# Patient Record
Sex: Female | Born: 1965 | Race: Black or African American | Hispanic: No | Marital: Single | Smoking: Never smoker
Health system: Southern US, Community
[De-identification: ages and names within clinical notes are randomized; demographics above are authoritative.]

## PROBLEM LIST (undated history)

## (undated) DIAGNOSIS — E7800 Pure hypercholesterolemia, unspecified: Secondary | ICD-10-CM

## (undated) DIAGNOSIS — I10 Essential (primary) hypertension: Secondary | ICD-10-CM

## (undated) DIAGNOSIS — R55 Syncope and collapse: Secondary | ICD-10-CM

---

## 1998-01-25 HISTORY — PX: NM GATED MYOCARDIAL STUDY (ARMX HX): HXRAD1849

## 2003-02-26 ENCOUNTER — Other Ambulatory Visit: Payer: Self-pay

## 2004-10-15 ENCOUNTER — Emergency Department: Payer: Self-pay | Admitting: Emergency Medicine

## 2006-02-19 ENCOUNTER — Emergency Department: Payer: Self-pay | Admitting: Emergency Medicine

## 2006-05-13 ENCOUNTER — Emergency Department: Payer: Self-pay | Admitting: Internal Medicine

## 2006-07-04 ENCOUNTER — Emergency Department: Payer: Self-pay | Admitting: Emergency Medicine

## 2006-12-11 ENCOUNTER — Emergency Department: Payer: Self-pay | Admitting: Emergency Medicine

## 2006-12-11 ENCOUNTER — Other Ambulatory Visit: Payer: Self-pay

## 2006-12-17 ENCOUNTER — Ambulatory Visit: Payer: Self-pay | Admitting: Emergency Medicine

## 2007-03-03 ENCOUNTER — Observation Stay: Payer: Self-pay | Admitting: Internal Medicine

## 2007-03-03 ENCOUNTER — Other Ambulatory Visit: Payer: Self-pay

## 2007-11-16 ENCOUNTER — Emergency Department: Payer: Self-pay | Admitting: Emergency Medicine

## 2008-04-04 ENCOUNTER — Emergency Department: Payer: Self-pay | Admitting: Emergency Medicine

## 2008-04-06 ENCOUNTER — Inpatient Hospital Stay: Payer: Self-pay | Admitting: Internal Medicine

## 2008-06-09 ENCOUNTER — Emergency Department: Payer: Self-pay | Admitting: Emergency Medicine

## 2008-07-31 ENCOUNTER — Emergency Department: Payer: Self-pay | Admitting: Emergency Medicine

## 2008-09-28 ENCOUNTER — Ambulatory Visit: Payer: Self-pay | Admitting: Family Medicine

## 2008-11-06 ENCOUNTER — Emergency Department: Payer: Self-pay | Admitting: Internal Medicine

## 2011-02-13 ENCOUNTER — Emergency Department: Payer: Self-pay | Admitting: Emergency Medicine

## 2011-08-05 ENCOUNTER — Emergency Department: Payer: Self-pay | Admitting: Emergency Medicine

## 2011-08-05 LAB — URINALYSIS, COMPLETE
Glucose,UR: NEGATIVE mg/dL (ref 0–75)
Hyaline Cast: 4
Leukocyte Esterase: NEGATIVE
Nitrite: NEGATIVE
Ph: 5 (ref 4.5–8.0)
Protein: 100
Specific Gravity: 1.024 (ref 1.003–1.030)

## 2011-08-05 LAB — COMPREHENSIVE METABOLIC PANEL
Albumin: 3.7 g/dL (ref 3.4–5.0)
Alkaline Phosphatase: 140 U/L — ABNORMAL HIGH (ref 50–136)
Anion Gap: 13 (ref 7–16)
BUN: 9 mg/dL (ref 7–18)
Chloride: 101 mmol/L (ref 98–107)
Co2: 24 mmol/L (ref 21–32)
Creatinine: 0.68 mg/dL (ref 0.60–1.30)
EGFR (African American): >60
EGFR (Non-African Amer.): >60
Glucose: 92 mg/dL (ref 65–99)
Potassium: 4 mmol/L (ref 3.5–5.1)
SGOT(AST): 57 U/L — ABNORMAL HIGH (ref 15–37)
SGPT (ALT): 57 U/L
Sodium: 138 mmol/L (ref 136–145)
Total Protein: 8.4 g/dL — ABNORMAL HIGH (ref 6.4–8.2)

## 2011-08-05 LAB — CBC
HGB: 12.8 g/dL (ref 12.0–16.0)
MCHC: 33.1 g/dL (ref 32.0–36.0)
MCV: 99 fL (ref 80–100)
Platelet: 289 10*3/uL (ref 150–440)
RDW: 12.5 % (ref 11.5–14.5)

## 2011-08-05 LAB — PREGNANCY, URINE: Pregnancy Test, Urine: NEGATIVE m[IU]/mL

## 2012-05-25 ENCOUNTER — Emergency Department: Payer: Self-pay | Admitting: Emergency Medicine

## 2012-05-25 LAB — DRUG SCREEN, URINE
Barbiturates, Ur Screen: NEGATIVE (ref ?–200)
Benzodiazepine, Ur Scrn: NEGATIVE (ref ?–200)
Cannabinoid 50 Ng, Ur ~~LOC~~: NEGATIVE (ref ?–50)
MDMA (Ecstasy)Ur Screen: NEGATIVE (ref ?–500)
Methadone, Ur Screen: NEGATIVE (ref ?–300)
Phencyclidine (PCP) Ur S: NEGATIVE (ref ?–25)
Tricyclic, Ur Screen: NEGATIVE (ref ?–1000)

## 2012-05-25 LAB — URINALYSIS, COMPLETE
Bacteria: NONE SEEN
Bilirubin,UR: NEGATIVE
Glucose,UR: NEGATIVE mg/dL (ref 0–75)
Leukocyte Esterase: NEGATIVE
Nitrite: NEGATIVE
Protein: 30
Squamous Epithelial: 1
WBC UR: 2 /HPF (ref 0–5)

## 2012-05-25 LAB — CBC
HCT: 39.3 % (ref 35.0–47.0)
HGB: 13.4 g/dL (ref 12.0–16.0)
MCH: 33.4 pg (ref 26.0–34.0)
MCHC: 34.1 g/dL (ref 32.0–36.0)
MCV: 98 fL (ref 80–100)
Platelet: 216 10*3/uL (ref 150–440)
RBC: 4.01 10*6/uL (ref 3.80–5.20)
RDW: 12.8 % (ref 11.5–14.5)
WBC: 5.8 10*3/uL (ref 3.6–11.0)

## 2012-05-25 LAB — COMPREHENSIVE METABOLIC PANEL
Albumin: 4.2 g/dL (ref 3.4–5.0)
Alkaline Phosphatase: 127 U/L (ref 50–136)
Anion Gap: 11 (ref 7–16)
Bilirubin,Total: 0.5 mg/dL (ref 0.2–1.0)
Calcium, Total: 10.3 mg/dL — ABNORMAL HIGH (ref 8.5–10.1)
Chloride: 99 mmol/L (ref 98–107)
Creatinine: 0.74 mg/dL (ref 0.60–1.30)
Glucose: 103 mg/dL — ABNORMAL HIGH (ref 65–99)
Osmolality: 269 (ref 275–301)
Potassium: 3.5 mmol/L (ref 3.5–5.1)
Sodium: 134 mmol/L — ABNORMAL LOW (ref 136–145)
Total Protein: 9.4 g/dL — ABNORMAL HIGH (ref 6.4–8.2)

## 2012-05-25 LAB — TROPONIN I: Troponin-I: <0.02 ng/mL

## 2012-05-25 LAB — ETHANOL
Ethanol %: <0.003 % (ref 0.000–0.080)
Ethanol: <3 mg/dL

## 2012-05-25 LAB — MAGNESIUM: Magnesium: 1.6 mg/dL — ABNORMAL LOW

## 2012-05-25 LAB — CK TOTAL AND CKMB (NOT AT ARMC)
CK, Total: 135 U/L (ref 21–215)
CK-MB: <0.5 ng/mL — ABNORMAL LOW (ref 0.5–3.6)

## 2012-10-16 ENCOUNTER — Emergency Department: Payer: Self-pay | Admitting: Emergency Medicine

## 2013-04-13 ENCOUNTER — Emergency Department: Payer: Self-pay | Admitting: Emergency Medicine

## 2013-04-13 LAB — BASIC METABOLIC PANEL
Anion Gap: 3 — ABNORMAL LOW (ref 7–16)
BUN: 14 mg/dL (ref 7–18)
CHLORIDE: 104 mmol/L (ref 98–107)
CO2: 27 mmol/L (ref 21–32)
CREATININE: 0.6 mg/dL (ref 0.60–1.30)
Calcium, Total: 9.1 mg/dL (ref 8.5–10.1)
Glucose: 98 mg/dL (ref 65–99)
Osmolality: 269 (ref 275–301)
Potassium: 4.4 mmol/L (ref 3.5–5.1)
Sodium: 134 mmol/L — ABNORMAL LOW (ref 136–145)

## 2013-04-13 LAB — CBC
HCT: 36 % (ref 35.0–47.0)
HGB: 12.1 g/dL (ref 12.0–16.0)
MCH: 33.5 pg (ref 26.0–34.0)
MCHC: 33.6 g/dL (ref 32.0–36.0)
MCV: 100 fL (ref 80–100)
Platelet: 229 10*3/uL (ref 150–440)
RBC: 3.61 10*6/uL — ABNORMAL LOW (ref 3.80–5.20)
RDW: 12.7 % (ref 11.5–14.5)
WBC: 5.5 10*3/uL (ref 3.6–11.0)

## 2013-04-14 ENCOUNTER — Emergency Department: Payer: Self-pay | Admitting: Emergency Medicine

## 2013-10-02 ENCOUNTER — Emergency Department: Payer: Self-pay | Admitting: Emergency Medicine

## 2013-10-02 LAB — COMPREHENSIVE METABOLIC PANEL
ALT: 38 U/L
AST: 44 U/L — AB (ref 15–37)
Albumin: 3.9 g/dL (ref 3.4–5.0)
Alkaline Phosphatase: 132 U/L — ABNORMAL HIGH
Anion Gap: 12 (ref 7–16)
BUN: 12 mg/dL (ref 7–18)
Bilirubin,Total: 0.3 mg/dL (ref 0.2–1.0)
CREATININE: 0.81 mg/dL (ref 0.60–1.30)
Calcium, Total: 9.4 mg/dL (ref 8.5–10.1)
Chloride: 101 mmol/L (ref 98–107)
Co2: 22 mmol/L (ref 21–32)
EGFR (Non-African Amer.): >60
Glucose: 112 mg/dL — ABNORMAL HIGH (ref 65–99)
Osmolality: 271 (ref 275–301)
Potassium: 3.3 mmol/L — ABNORMAL LOW (ref 3.5–5.1)
SODIUM: 135 mmol/L — AB (ref 136–145)
Total Protein: 8.8 g/dL — ABNORMAL HIGH (ref 6.4–8.2)

## 2013-10-02 LAB — CBC
HCT: 41.3 % (ref 35.0–47.0)
HGB: 13.6 g/dL (ref 12.0–16.0)
MCH: 32.9 pg (ref 26.0–34.0)
MCHC: 32.9 g/dL (ref 32.0–36.0)
MCV: 100 fL (ref 80–100)
Platelet: 219 10*3/uL (ref 150–440)
RBC: 4.13 10*6/uL (ref 3.80–5.20)
RDW: 13 % (ref 11.5–14.5)
WBC: 8.1 10*3/uL (ref 3.6–11.0)

## 2013-10-02 LAB — URINALYSIS, COMPLETE
Bacteria: NONE SEEN
Bilirubin,UR: NEGATIVE
Glucose,UR: NEGATIVE mg/dL (ref 0–75)
Ketone: NEGATIVE
LEUKOCYTE ESTERASE: NEGATIVE
NITRITE: NEGATIVE
PH: 5 (ref 4.5–8.0)
Protein: 100
RBC,UR: 2 /HPF (ref 0–5)
SPECIFIC GRAVITY: 1.008 (ref 1.003–1.030)
Squamous Epithelial: 1

## 2013-10-02 LAB — DRUG SCREEN, URINE
AMPHETAMINES, UR SCREEN: NEGATIVE (ref ?–1000)
Barbiturates, Ur Screen: NEGATIVE (ref ?–200)
Benzodiazepine, Ur Scrn: NEGATIVE (ref ?–200)
COCAINE METABOLITE, UR ~~LOC~~: POSITIVE (ref ?–300)
Cannabinoid 50 Ng, Ur ~~LOC~~: NEGATIVE (ref ?–50)
MDMA (Ecstasy)Ur Screen: NEGATIVE (ref ?–500)
METHADONE, UR SCREEN: NEGATIVE (ref ?–300)
Opiate, Ur Screen: NEGATIVE (ref ?–300)
Phencyclidine (PCP) Ur S: NEGATIVE (ref ?–25)
TRICYCLIC, UR SCREEN: NEGATIVE (ref ?–1000)

## 2013-10-02 LAB — ETHANOL
ETHANOL %: 0.298 % — AB (ref 0.000–0.080)
Ethanol: 298 mg/dL

## 2013-10-02 LAB — SALICYLATE LEVEL: Salicylates, Serum: <1.7 mg/dL

## 2013-10-02 LAB — ACETAMINOPHEN LEVEL: Acetaminophen: <2 ug/mL

## 2014-02-13 ENCOUNTER — Emergency Department: Payer: Self-pay | Admitting: Emergency Medicine

## 2014-02-13 LAB — BASIC METABOLIC PANEL
ANION GAP: 10 (ref 7–16)
BUN: 11 mg/dL (ref 7–18)
CO2: 23 mmol/L (ref 21–32)
Calcium, Total: 8.6 mg/dL (ref 8.5–10.1)
Chloride: 106 mmol/L (ref 98–107)
Creatinine: 0.73 mg/dL (ref 0.60–1.30)
EGFR (African American): >60
EGFR (Non-African Amer.): >60
Glucose: 95 mg/dL (ref 65–99)
Osmolality: 277 (ref 275–301)
POTASSIUM: 4 mmol/L (ref 3.5–5.1)
Sodium: 139 mmol/L (ref 136–145)

## 2014-02-13 LAB — CBC
HCT: 38.7 % (ref 35.0–47.0)
HGB: 12.6 g/dL (ref 12.0–16.0)
MCH: 32.5 pg (ref 26.0–34.0)
MCHC: 32.6 g/dL (ref 32.0–36.0)
MCV: 100 fL (ref 80–100)
PLATELETS: 242 10*3/uL (ref 150–440)
RBC: 3.88 10*6/uL (ref 3.80–5.20)
RDW: 12.8 % (ref 11.5–14.5)
WBC: 4.2 10*3/uL (ref 3.6–11.0)

## 2014-02-13 LAB — TROPONIN I: Troponin-I: <0.02 ng/mL

## 2014-05-12 ENCOUNTER — Emergency Department: Payer: Self-pay | Admitting: Emergency Medicine
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
## 2014-11-25 HISTORY — PX: TRANSTHORACIC ECHOCARDIOGRAM: SHX275

## 2014-12-06 ENCOUNTER — Encounter: Payer: Self-pay | Admitting: Radiology

## 2014-12-06 ENCOUNTER — Observation Stay
Admission: EM | Admit: 2014-12-06 | Discharge: 2014-12-07 | Disposition: A | Discharge status: Home / Self Care | Payer: Medicaid Other | Attending: Specialist | Admitting: Specialist

## 2014-12-06 ENCOUNTER — Observation Stay (HOSPITAL_BASED_OUTPATIENT_CLINIC_OR_DEPARTMENT_OTHER)
Admit: 2014-12-06 | Discharge: 2014-12-06 | Disposition: A | Discharge status: Home / Self Care | Payer: Medicaid Other | Attending: Internal Medicine | Admitting: Internal Medicine

## 2014-12-06 ENCOUNTER — Emergency Department: Payer: Medicaid Other

## 2014-12-06 DIAGNOSIS — I071 Rheumatic tricuspid insufficiency: Secondary | ICD-10-CM | POA: Diagnosis not present

## 2014-12-06 DIAGNOSIS — J9811 Atelectasis: Secondary | ICD-10-CM | POA: Insufficient documentation

## 2014-12-06 DIAGNOSIS — I340 Nonrheumatic mitral (valve) insufficiency: Secondary | ICD-10-CM | POA: Insufficient documentation

## 2014-12-06 DIAGNOSIS — F1010 Alcohol abuse, uncomplicated: Secondary | ICD-10-CM | POA: Diagnosis not present

## 2014-12-06 DIAGNOSIS — I10 Essential (primary) hypertension: Secondary | ICD-10-CM | POA: Insufficient documentation

## 2014-12-06 DIAGNOSIS — R0989 Other specified symptoms and signs involving the circulatory and respiratory systems: Secondary | ICD-10-CM

## 2014-12-06 DIAGNOSIS — R0602 Shortness of breath: Secondary | ICD-10-CM | POA: Insufficient documentation

## 2014-12-06 DIAGNOSIS — R079 Chest pain, unspecified: Principal | ICD-10-CM | POA: Insufficient documentation

## 2014-12-06 DIAGNOSIS — R0600 Dyspnea, unspecified: Secondary | ICD-10-CM | POA: Insufficient documentation

## 2014-12-06 DIAGNOSIS — Z8249 Family history of ischemic heart disease and other diseases of the circulatory system: Secondary | ICD-10-CM | POA: Insufficient documentation

## 2014-12-06 DIAGNOSIS — I951 Orthostatic hypotension: Secondary | ICD-10-CM | POA: Diagnosis not present

## 2014-12-06 DIAGNOSIS — R001 Bradycardia, unspecified: Secondary | ICD-10-CM | POA: Diagnosis not present

## 2014-12-06 DIAGNOSIS — M545 Low back pain: Secondary | ICD-10-CM | POA: Diagnosis not present

## 2014-12-06 DIAGNOSIS — K760 Fatty (change of) liver, not elsewhere classified: Secondary | ICD-10-CM | POA: Diagnosis not present

## 2014-12-06 DIAGNOSIS — R0609 Other forms of dyspnea: Secondary | ICD-10-CM

## 2014-12-06 DIAGNOSIS — F1410 Cocaine abuse, uncomplicated: Secondary | ICD-10-CM | POA: Insufficient documentation

## 2014-12-06 HISTORY — DX: Essential (primary) hypertension: I10

## 2014-12-06 LAB — CBC
HEMATOCRIT: 37.7 % (ref 35.0–47.0)
Hemoglobin: 12.5 g/dL (ref 12.0–16.0)
MCH: 32.1 pg (ref 26.0–34.0)
MCHC: 33.1 g/dL (ref 32.0–36.0)
MCV: 96.8 fL (ref 80.0–100.0)
PLATELETS: 232 10*3/uL (ref 150–440)
RBC: 3.89 MIL/uL (ref 3.80–5.20)
RDW: 13.1 % (ref 11.5–14.5)
WBC: 4.7 10*3/uL (ref 3.6–11.0)

## 2014-12-06 LAB — URINE DRUG SCREEN, QUALITATIVE (ARMC ONLY)
AMPHETAMINES, UR SCREEN: NOT DETECTED
BARBITURATES, UR SCREEN: NOT DETECTED
BENZODIAZEPINE, UR SCRN: NOT DETECTED
CANNABINOID 50 NG, UR ~~LOC~~: NOT DETECTED
Cocaine Metabolite,Ur ~~LOC~~: POSITIVE — AB
MDMA (Ecstasy)Ur Screen: NOT DETECTED
Methadone Scn, Ur: NOT DETECTED
Opiate, Ur Screen: NOT DETECTED
PHENCYCLIDINE (PCP) UR S: NOT DETECTED
Tricyclic, Ur Screen: NOT DETECTED

## 2014-12-06 LAB — BASIC METABOLIC PANEL
ANION GAP: 9 (ref 5–15)
BUN: 13 mg/dL (ref 6–20)
CALCIUM: 9.8 mg/dL (ref 8.9–10.3)
CO2: 28 mmol/L (ref 22–32)
CREATININE: 0.63 mg/dL (ref 0.44–1.00)
Chloride: 102 mmol/L (ref 101–111)
GLUCOSE: 140 mg/dL — AB (ref 65–99)
POTASSIUM: 4.2 mmol/L (ref 3.5–5.1)
SODIUM: 139 mmol/L (ref 135–145)

## 2014-12-06 LAB — APTT: APTT: 27 s (ref 24–36)

## 2014-12-06 LAB — PROTIME-INR
INR: 0.98
Prothrombin Time: 13.2 seconds (ref 11.4–15.0)

## 2014-12-06 LAB — TROPONIN I
Troponin I: <0.03 ng/mL (ref <0.031)
Troponin I: <0.03 ng/mL (ref <0.031)
Troponin I: <0.03 ng/mL (ref <0.031)

## 2014-12-06 MED ORDER — ONDANSETRON HCL 4 MG/2ML IJ SOLN
4.0000 mg | Freq: Four times a day (QID) | INTRAMUSCULAR | Status: DC | PRN
  Administered 2014-12-06: 4 mg via INTRAVENOUS
  Filled 2014-12-06: qty 2

## 2014-12-06 MED ORDER — ENOXAPARIN SODIUM 40 MG/0.4ML ~~LOC~~ SOLN
40.0000 mg | SUBCUTANEOUS | Status: DC
  Administered 2014-12-06: 40 mg via SUBCUTANEOUS
  Filled 2014-12-06: qty 0.4

## 2014-12-06 MED ORDER — METOPROLOL TARTRATE 25 MG PO TABS: 25.0000 mg | ORAL_TABLET | Freq: Two times a day (BID) | ORAL | Status: DC

## 2014-12-06 MED ORDER — SODIUM CHLORIDE 0.9 % IV SOLN
INTRAVENOUS | Status: DC
  Administered 2014-12-06: 18:00:00 via INTRAVENOUS

## 2014-12-06 MED ORDER — INFLUENZA VAC SPLIT QUAD 0.5 ML IM SUSY
0.5000 mL | PREFILLED_SYRINGE | INTRAMUSCULAR | Status: DC
  Filled 2014-12-06: qty 0.5

## 2014-12-06 MED ORDER — IOHEXOL 350 MG/ML SOLN
75.0000 mL | Freq: Once | INTRAVENOUS | Status: AC | PRN
  Administered 2014-12-06: 75 mL via INTRAVENOUS

## 2014-12-06 MED ORDER — ASPIRIN 81 MG PO CHEW
324.0000 mg | CHEWABLE_TABLET | Freq: Once | ORAL | Status: AC
  Administered 2014-12-06: 324 mg via ORAL
  Filled 2014-12-06: qty 4

## 2014-12-06 MED ORDER — ASPIRIN EC 325 MG PO TBEC
325.0000 mg | DELAYED_RELEASE_TABLET | Freq: Every day | ORAL | Status: DC
  Administered 2014-12-07: 325 mg via ORAL
  Filled 2014-12-06: qty 1

## 2014-12-06 MED ORDER — HYDRALAZINE HCL 20 MG/ML IJ SOLN
10.0000 mg | Freq: Four times a day (QID) | INTRAMUSCULAR | Status: DC | PRN
  Administered 2014-12-06: 10 mg via INTRAVENOUS
  Filled 2014-12-06: qty 1

## 2014-12-06 MED ORDER — GI COCKTAIL ~~LOC~~
30.0000 mL | Freq: Four times a day (QID) | ORAL | Status: DC | PRN
Start: 2014-12-06 — End: 2014-12-07
  Filled 2014-12-06: qty 30

## 2014-12-06 MED ORDER — ACETAMINOPHEN 325 MG PO TABS
650.0000 mg | ORAL_TABLET | ORAL | Status: DC | PRN
  Administered 2014-12-06: 650 mg via ORAL
  Filled 2014-12-06: qty 2

## 2014-12-06 NOTE — ED Provider Notes (Signed)
[facility] Emergency Department Provider Note  ____________________________________________   I have reviewed the triage vital signs and the nursing notes.   HISTORY  Chief Complaint Shortness of Breath    HPI Catherine Richardson is a 49 y.o. female with a history of EtOH abuse, with sinus with hypertension several years ago but is not compliant with medication and has not followed up. She states that she has a strong family history of CAD in both her parents. Her mother had a heart attack in her 71s. Patient presents today complaining of dyspnea. She has not had chest pain but she has had dyspnea to the point where she cannot walk around without getting winded and having to stop. She has also had diaphoresis with this. She denies any leg swelling recent travel or personal history of PE or DVT. She is not on exogenous estrogens. She does however have a strong family history of blood clots and multiple uncles and cousins. The symptoms have been going on for approximately 3 days.She also has some low back pain which she feels is unrelated.  History reviewed. No pertinent past medical history.  There are no active problems to display for this patient.   No past surgical history on file.  No current outpatient prescriptions on file.  Allergies Review of patient's allergies indicates no known allergies.  No family history on file.  Social History Social History  Substance Use Topics  . Smoking status: None  . Smokeless tobacco: None  . Alcohol Use: None    Review of Systems Constitutional: No fever/chills Eyes: No visual changes. ENT: No sore throat. No stiff neck no neck pain Cardiovascular: Denies chest pain. Respiratory: Denies shortness of breath. Gastrointestinal:   no vomiting.  No diarrhea.  No constipation. Genitourinary: Negative for dysuria. Musculoskeletal: Negative lower extremity swelling Skin: Negative for rash. Neurological: Negative  for headaches, focal weakness or numbness. 10-point ROS otherwise negative.  ____________________________________________   PHYSICAL EXAM:  VITAL SIGNS: ED Triage Vitals  Enc Vitals Group     BP 12/06/14 1056 207/96 mmHg     Pulse Rate 12/06/14 1056 105     Resp 12/06/14 1056 18     Temp 12/06/14 1056 98.1 F (36.7 C)     Temp src --      SpO2 12/06/14 1056 97 %     Weight --      Height --      Head Cir --      Peak Flow --      Pain Score 12/06/14 1057 0     Pain Loc --      Pain Edu? --      Excl. in Echo? --     Constitutional: Alert and oriented. Well appearing and in no acute distress. Eyes: Conjunctivae are normal. PERRL. EOMI. Head: Atraumatic. Nose: No congestion/rhinnorhea. Mouth/Throat: Mucous membranes are moist.  Oropharynx non-erythematous. Neck: No stridor.   Nontender with no meningismus Cardiovascular: Normal rate, regular rhythm. Grossly normal heart sounds.  Good peripheral circulation. Respiratory: Normal respiratory effort.  No retractions. Lungs CTAB. Gastrointestinal: Soft and nontender. No distention. No guarding no rebound Back:  There is no focal tenderness or step off to the spine there is no midline tenderness there are no lesions noted. there is no CVA tenderness there is minimal tenderness to palpation in the paraspinal muscles of the lower right lumbar region Musculoskeletal: No lower extremity tenderness. No joint effusions, no DVT signs strong distal pulses no edema Neurologic:  Normal speech and language. No gross focal neurologic deficits are appreciated.  Skin:  Skin is warm, dry and intact. No rash noted. Psychiatric: Mood and affect are normal. Speech and behavior are normal.  ____________________________________________   LABS (all labs ordered are listed, but only abnormal results are displayed)  Labs Reviewed  BASIC METABOLIC PANEL - Abnormal; Notable for the following:    Glucose, Bld 140 (*)    All other components within  normal limits  CBC  TROPONIN I  PROTIME-INR  APTT   ____________________________________________  EKG  I have interpreted EKG, 90 bpm no acute ST elevation or depression normal axis, nonspecific ST changes. ____________________________________________  HFSFSELTR  I reviewed x-rays and CT ____________________________________________   PROCEDURES  Procedure(s) performed: None  Critical Care performed: None  ____________________________________________   INITIAL IMPRESSION / ASSESSMENT AND PLAN / ED COURSE  Pertinent labs & imaging results that were available during my care of the patient were reviewed by me and considered in my medical decision making (see chart for details).  Patient with exertional dyspnea and hypertension, a concerning story. ACS is certainly something that should be ruled out thus far her blood work and EKG are reassuring. Given a strong family history of PE and DVTs and multiple cousins and uncles, we will also obtain a CT scan of her chest. Patient is in no acute distress at this time and not having chest pain. ____________________________________________   FINAL CLINICAL IMPRESSION(S) / ED DIAGNOSES  Final diagnoses:  None     Schuyler Amor, MD 12/06/14 1434

## 2014-12-06 NOTE — Progress Notes (Signed)
When patient admitted to floor BP elevated 790 systolic, IV hydralazine given per PRN order. BP rechecked 35 min later, BP stable at 383 systolic. At approximately 1720 patient reported feeling nauseous, gave patient Zofran. Patient then stated that she felt like she may pass out. At this time patient HR in 40's and BP dropped to 96/43. Dr. Manuella Ghazi paged and IVF started. Dr. Rockey Situ paged as well. Dr. Rockey Situ reported sudden drop in BP could be related to cocaine use, patient positive for cocaine in urine, ordered for no other BP meds to be given at this time, IVF to be continued until BP stable in 338'V systolic, and orthostatics to be completed once around 2200 after IVF have stabilized BP. Patient resting comfortably at this time. Wilnette Kales

## 2014-12-06 NOTE — ED Notes (Signed)
Patient transported to CT 

## 2014-12-06 NOTE — Progress Notes (Signed)
Patient alert and oriented x4. Oriented to room, unit, and call bell. Admission completed. No complaints at this time. Will cont to assess. Skin assessment verified by Carlyle Dolly., RN. Telemetry box verified. Wilnette Kales
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
## 2014-12-06 NOTE — ED Notes (Signed)
Pt reports 3 days SOB. Pt denies cough. Pt denies any other symptoms

## 2014-12-07 LAB — TROPONIN I: Troponin I: <0.03 ng/mL (ref <0.031)

## 2014-12-07 MED ORDER — LISINOPRIL-HYDROCHLOROTHIAZIDE 10-12.5 MG PO TABS: 1.0000 | ORAL_TABLET | Freq: Every day | ORAL | Status: DC

## 2014-12-07 NOTE — Progress Notes (Signed)
  All D/C forms completed and given to pt. Personal belongings returned to pt. IV d/c intact and telemetry also. Pt is aware of needing to follow up with a PCP. Denies any pain or discomfort at this moment

## 2014-12-07 NOTE — Discharge Instructions (Signed)

## 2014-12-07 NOTE — Discharge Summary (Signed)
[place] [facility] [place] NAME: Catherine Richardson    MR#:  [id number]  DATE OF BIRTH:  December 23, 1965  DATE OF ADMISSION:  12/06/2014 ADMITTING PHYSICIAN: Max Sane, MD  DATE OF DISCHARGE: 12/07/2014 11:43 AM  PRIMARY CARE PHYSICIAN: No PCP Per Patient    ADMISSION DIAGNOSIS:  Dyspnea [R06.00]  DISCHARGE DIAGNOSIS:  Active Problems:   Chest pain   Dyspnea   HTN (hypertension), malignant   Cocaine abuse   ETOH abuse   Orthostatic hypotension   SECONDARY DIAGNOSIS:   Past Medical History  Diagnosis Date  . Hypertension     HOSPITAL COURSE:   49 year old female with past medical history of hypertension, cocaine abuse who presented to the hospital with acute onset shortness of breath.  #1 shortness of breath-this was thought to be an anginal equivalent as patient has had a recent intake of cocaine abuse. Patient was observed overnight on telemetry and had 3 sets of cardiac markers checked which were negative. -Patient was seen by cardiology this was unlikely acute coronary syndrome. Patient had no acute chest pain, no acute EKG changes. -She was therefore discharged home and strongly advise to abstain from cocaine.  #2 hypertension-patient has a previous history of essential hypertension but was not taking any antihypertensives.  Patient's hemodynamics have improved while in the hospital. Patient had one episode of severe hypotension which was likely related to hydralazine she received in the hospital. -She is presently being discharged on low-dose lisinopril/HCTZ.  #3 alcohol abuse-patient had no evidence of alcohol withdrawal.  #4 cocaine abuse-patient was strongly advised to abstain from cocaine abuse.  DISCHARGE CONDITIONS:   Stable  CONSULTS OBTAINED:  Treatment Team:  Minna Merritts, MD  DRUG ALLERGIES:  No Known Allergies  DISCHARGE MEDICATIONS:   Discharge Medication List as of 12/07/2014 11:26 AM    START  taking these medications   Details  lisinopril-hydrochlorothiazide (PRINZIDE,ZESTORETIC) 10-12.5 MG tablet Take 1 tablet by mouth daily., Starting 12/07/2014, Until Discontinued, Print         DISCHARGE INSTRUCTIONS:   DIET:  Cardiac diet  DISCHARGE CONDITION:  Stable  ACTIVITY:  Activity as tolerated  OXYGEN:  Home Oxygen: No.   Oxygen Delivery: room air  DISCHARGE LOCATION:  home   If you experience worsening of your admission symptoms, develop shortness of breath, life threatening emergency, suicidal or homicidal thoughts you must seek medical attention immediately by calling 911 or calling your MD immediately  if symptoms less severe.  You Must read complete instructions/literature along with all the possible adverse reactions/side effects for all the Medicines you take and that have been prescribed to you. Take any new Medicines after you have completely understood and accpet all the possible adverse reactions/side effects.   Please note  You were cared for by a hospitalist during your hospital stay. If you have any questions about your discharge medications or the care you received while you were in the hospital after you are discharged, you can call the unit and asked to speak with the hospitalist on call if the hospitalist that took care of you is not available. Once you are discharged, your primary care physician will handle any further medical issues. Please note that NO REFILLS for any discharge medications will be authorized once you are discharged, as it is imperative that you return to your primary care physician (or establish a relationship with a primary care physician if you do not have one) for your aftercare needs so  that they can reassess your need for medications and monitor your lab values.     Today   No chest pain, shortness of breath, nausea, vomiting.  VITAL SIGNS:  Blood pressure 142/71, pulse 79, temperature 97.1 F (36.2 C), temperature source  Oral, resp. rate 17, height 5\' 6"  (1.676 m), weight 71.033 kg (156 lb 9.6 oz), SpO2 99 %.  I/O:   Intake/Output Summary (Last 24 hours) at 12/07/14 1520 Last data filed at 12/07/14 0830  Gross per 24 hour  Intake 146.25 ml  Output    650 ml  Net -503.75 ml    PHYSICAL EXAMINATION:  GENERAL:  49 y.o.-year-old patient lying in the bed with no acute distress.  EYES: Pupils equal, round, reactive to light and accommodation. No scleral icterus. Extraocular muscles intact.  HEENT: Head atraumatic, normocephalic. Oropharynx and nasopharynx clear.  NECK:  Supple, no jugular venous distention. No thyroid enlargement, no tenderness.  LUNGS: Normal breath sounds bilaterally, no wheezing, rales,rhonchi. No use of accessory muscles of respiration.  CARDIOVASCULAR: S1, S2 normal. No murmurs, rubs, or gallops.  ABDOMEN: Soft, non-tender, non-distended. Bowel sounds present. No organomegaly or mass.  EXTREMITIES: No pedal edema, cyanosis, or clubbing.  NEUROLOGIC: Cranial nerves II through XII are intact. No focal motor or sensory defecits b/l.  PSYCHIATRIC: The patient is alert and oriented x 3. Good affect.  SKIN: No obvious rash, lesion, or ulcer.   DATA REVIEW:   CBC  Recent Labs Lab 12/06/14 1135  WBC 4.7  HGB 12.5  HCT 37.7  PLT 232    Chemistries   Recent Labs Lab 12/06/14 1135  NA 139  K 4.2  CL 102  CO2 28  GLUCOSE 140*  BUN 13  CREATININE 0.63  CALCIUM 9.8    Cardiac Enzymes  Recent Labs Lab 12/07/14 0052  TROPONINI <0.03    Microbiology Results  No results found for this or any previous visit.  RADIOLOGY:  Dg Chest 2 View  12/06/2014  CLINICAL DATA:  Three days shortness of breath. EXAM: CHEST  2 VIEW COMPARISON:  05/12/2014 FINDINGS: The heart size and mediastinal contours are within normal limits. Both lungs are clear. The visualized skeletal structures are unremarkable. IMPRESSION: No active cardiopulmonary disease. Electronically Signed   By: Rolm Baptise M.D.   On: 12/06/2014 12:42   Ct Angio Chest Pe W/cm &/or Wo Cm  12/06/2014  CLINICAL DATA:  Shortness of breath. EXAM: CT ANGIOGRAPHY CHEST WITH CONTRAST TECHNIQUE: Multidetector CT imaging of the chest was performed using the standard protocol during bolus administration of intravenous contrast. Multiplanar CT image reconstructions and MIPs were obtained to evaluate the vascular anatomy. CONTRAST:  35mL OMNIPAQUE IOHEXOL 350 MG/ML SOLN COMPARISON:  Chest radiograph December 06, 2014 FINDINGS: There is no demonstrable pulmonary embolus. There is no thoracic aortic aneurysm or dissection. The visualized great vessels appear normal. There is mild dependent atelectasis in the bases. There is no edema or consolidation. Thyroid appears normal. No adenopathy is appreciable. Pericardium is not thickened. In the visualized upper abdomen, there is hepatic steatosis. Gallbladder is contracted. There is evidence of old healed trauma involving the posterior left fifth rib. No blastic or lytic bone lesions. Review of the MIP images confirms the above findings. IMPRESSION: No demonstrable pulmonary embolus. No edema or consolidation. No adenopathy. Electronically Signed   By: Lowella Grip III M.D.   On: 12/06/2014 14:33      Management plans discussed with the patient, family and they are in agreement.  CODE  STATUS:   TOTAL TIME TAKING CARE OF THIS PATIENT: 40 minutes.    Henreitta Leber M.D on 12/07/2014 at 3:20 PM  Between 7am to 6pm - Pager - [phone]  After 6pm go to [email or web address] - password [email or web address] [facility]  [place] [facility]  Office  [phone]  CC: Primary care physician; No PCP Per Patient

## 2015-04-17 ENCOUNTER — Emergency Department
Admission: EM | Admit: 2015-04-17 | Discharge: 2015-04-17 | Disposition: A | Discharge status: Home / Self Care | Payer: Medicaid Other | Attending: Emergency Medicine | Admitting: Emergency Medicine

## 2015-04-17 ENCOUNTER — Emergency Department: Payer: Medicaid Other

## 2015-04-17 ENCOUNTER — Encounter: Payer: Self-pay | Admitting: Medical Oncology

## 2015-04-17 DIAGNOSIS — Z79899 Other long term (current) drug therapy: Secondary | ICD-10-CM | POA: Insufficient documentation

## 2015-04-17 DIAGNOSIS — I10 Essential (primary) hypertension: Secondary | ICD-10-CM | POA: Diagnosis not present

## 2015-04-17 DIAGNOSIS — R109 Unspecified abdominal pain: Secondary | ICD-10-CM | POA: Insufficient documentation

## 2015-04-17 DIAGNOSIS — M549 Dorsalgia, unspecified: Secondary | ICD-10-CM | POA: Diagnosis not present

## 2015-04-17 LAB — URINALYSIS COMPLETE WITH MICROSCOPIC (ARMC ONLY)
BACTERIA UA: NONE SEEN
BILIRUBIN URINE: NEGATIVE
GLUCOSE, UA: NEGATIVE mg/dL
Ketones, ur: NEGATIVE mg/dL
Nitrite: NEGATIVE
Protein, ur: 30 mg/dL — AB
Specific Gravity, Urine: 1.026 (ref 1.005–1.030)
pH: 5 (ref 5.0–8.0)

## 2015-04-17 MED ORDER — IBUPROFEN 600 MG PO TABS: 600.0000 mg | ORAL_TABLET | Freq: Four times a day (QID) | ORAL | Status: DC | PRN

## 2015-04-17 MED ORDER — CIPROFLOXACIN HCL 500 MG PO TABS: 500.0000 mg | ORAL_TABLET | Freq: Two times a day (BID) | ORAL | Status: AC

## 2015-04-17 MED ORDER — CYCLOBENZAPRINE HCL 5 MG PO TABS: 5.0000 mg | ORAL_TABLET | Freq: Three times a day (TID) | ORAL | Status: DC | PRN

## 2015-04-17 NOTE — ED Notes (Signed)
Pt reports that she began having rt sided flank pain Sunday, pain has increased. Denies injury. Denies dysuria.

## 2015-04-17 NOTE — ED Notes (Signed)
AAOx3.  Skin warm and dry.  Ambulates with easy and steady gait. NAD 

## 2015-04-17 NOTE — Discharge Instructions (Signed)
Please take your medications as prescribed. Please follow-up your primary care physician 1-2 days for recheck/reevaluation. Return to the emergency department for any worsening symptoms.   Flank Pain Flank pain refers to pain that is located on the side of the body between the upper abdomen and the back. The pain may occur over a short period of time (acute) or may be long-term or reoccurring (chronic). It may be mild or severe. Flank pain can be caused by many things. CAUSES  Some of the more common causes of flank pain include:  Muscle strains.   Muscle spasms.   A disease of your spine (vertebral disk disease).   A lung infection (pneumonia).   Fluid around your lungs (pulmonary edema).   A kidney infection.   Kidney stones.   A very painful skin rash caused by the chickenpox virus (shingles).   Gallbladder disease.  Brandon care will depend on the cause of your pain. In general,  Rest as directed by your caregiver.  Drink enough fluids to keep your urine clear or pale yellow.  Only take over-the-counter or prescription medicines as directed by your caregiver. Some medicines may help relieve the pain.  Tell your caregiver about any changes in your pain.  Follow up with your caregiver as directed. SEEK IMMEDIATE MEDICAL CARE IF:   Your pain is not controlled with medicine.   You have new or worsening symptoms.  Your pain increases.   You have abdominal pain.   You have shortness of breath.   You have persistent nausea or vomiting.   You have swelling in your abdomen.   You feel faint or pass out.   You have blood in your urine.  You have a fever or persistent symptoms for more than 2-3 days.  You have a fever and your symptoms suddenly get worse. MAKE SURE YOU:   Understand these instructions.  Will watch your condition.  Will get help right away if you are not doing well or get worse.   This information is not  intended to replace advice given to you by your health care provider. Make sure you discuss any questions you have with your health care provider.   Document Released: 04/03/2005 Document Revised: 11/05/2011 Document Reviewed: 09/25/2011 Elsevier Interactive Patient Education [employer].

## 2015-04-17 NOTE — ED Provider Notes (Signed)
[facility] Emergency Department Provider Note  Time seen: 2:49 PM  I have reviewed the triage vital signs and the nursing notes.   HISTORY  Chief Complaint Flank Pain    HPI Catherine Richardson is a 50 y.o. female with a past medical history of hypertension presents the emergency department for right flank pain. According to the patient for the past 3 days she has been drinking right flank pain. Yesterday was the most severe, today it has improved but is still present. Denies any hematuria or dysuria. States the pain is much worse with movement especially bending or twisting. Patient does not recall any inciting event. Describes the pain as moderate currently located in the right back/right flank. Describes it as a dull nagging pain     Past Medical History  Diagnosis Date  . Hypertension     Patient Active Problem List   Diagnosis Date Noted  . Chest pain 12/06/2014  . Dyspnea   . HTN (hypertension), malignant   . Cocaine abuse   . ETOH abuse   . Orthostatic hypotension     History reviewed. No pertinent past surgical history.  Current Outpatient Rx  Name  Route  Sig  Dispense  Refill  . lisinopril-hydrochlorothiazide (PRINZIDE,ZESTORETIC) 10-12.5 MG tablet   Oral   Take 1 tablet by mouth daily.   60 tablet   1     Allergies Review of patient's allergies indicates no known allergies.  Family History  Problem Relation Age of Onset  . CAD Mother   . CAD Father   . Deep vein thrombosis Brother     Social History Social History  Substance Use Topics  . Smoking status: Never Smoker   . Smokeless tobacco: None  . Alcohol Use: 1.8 oz/week    3 Shots of liquor per week    Review of Systems Constitutional: Negative for fever. Cardiovascular: Negative for chest pain. Respiratory: Negative for shortness of breath. Gastrointestinal: Negative for abdominal pain, vomiting and diarrhea. Genitourinary: Negative for dysuria. Negative for  hematuria Musculoskeletal: Positive for right back pain Neurological: Negative for headache 10-point ROS otherwise negative.  ____________________________________________   PHYSICAL EXAM:  VITAL SIGNS: ED Triage Vitals  Enc Vitals Group     BP 04/17/15 1326 153/84 mmHg     Pulse Rate 04/17/15 1326 95     Resp 04/17/15 1326 18     Temp 04/17/15 1326 98 F (36.7 C)     Temp Source 04/17/15 1326 Oral     SpO2 04/17/15 1326 98 %     Weight 04/17/15 1326 160 lb (72.576 kg)     Height 04/17/15 1326 5\' 8"  (1.727 m)     Head Cir --      Peak Flow --      Pain Score 04/17/15 1327 8     Pain Loc --      Pain Edu? --      Excl. in [place]? --     Constitutional: Alert and oriented. Well appearing and in no distress. Eyes: Normal exam ENT   Head: Normocephalic and atraumatic.   Mouth/Throat: Mucous membranes are moist. Cardiovascular: Normal rate, regular rhythm. No murmur Respiratory: Normal respiratory effort without tachypnea nor retractions. Breath sounds are clear Gastrointestinal: Soft and nontender. No distention. No CVA tenderness. Musculoskeletal: Nontender with normal range of motion in all extremities. Moderate back tenderness to palpation in the area of the right sacroiliac joint. No midline tenderness. Neurologic:  Normal speech and language. No gross  focal neurologic deficits  Skin:  Skin is warm, dry and intact.  Psychiatric: Mood and affect are normal. Speech and behavior are normal. ____________________________________________   RADIOLOGY  CT negative  ____________________________________________    INITIAL IMPRESSION / ASSESSMENT AND PLAN / ED COURSE  Pertinent labs & imaging results that were available during my care of the patient were reviewed by me and considered in my medical decision making (see chart for details).  Patient presents to the emergency department right flank pain. Patient states she thinks she may have had a kidney stone when she  was in high school proximally 35 years ago, but has not had any since. Denies hematuria or dysuria. Patient does have moderate tenderness to palpation in this area however urinalysis shows 2+ hemoglobin. Urinalysis also shows 3+ leukocytes, with white blood cells and red blood cells. We will perform a noncontrast CT scan to rule out ureterolithiasis. Given the equivocal urinalysis I will send a urine culture but I will also cover with antibiotics. Patient's exam is very suggestive of musculoskeletal pain given the reproducibility.   CT negative we will cover with a short course of antibiotics for possible urinary infection. I have added a urinary culture to confirm. Patient's symptoms are very reproducible on examination, suspect musculoskeletal pain. We will discharge with Flexeril and ibuprofen, patient is follow up with a primary care physician.  ____________________________________________   FINAL CLINICAL IMPRESSION(S) / ED DIAGNOSES  Right flank pain   Harvest Dark, MD 04/17/15 1542

## 2015-08-30 ENCOUNTER — Emergency Department
Admission: EM | Admit: 2015-08-30 | Discharge: 2015-08-30 | Disposition: A | Discharge status: Home / Self Care | Payer: Medicaid Other | Attending: Emergency Medicine | Admitting: Emergency Medicine

## 2015-08-30 DIAGNOSIS — I159 Secondary hypertension, unspecified: Secondary | ICD-10-CM | POA: Insufficient documentation

## 2015-08-30 DIAGNOSIS — F1410 Cocaine abuse, uncomplicated: Secondary | ICD-10-CM | POA: Diagnosis not present

## 2015-08-30 DIAGNOSIS — Z79899 Other long term (current) drug therapy: Secondary | ICD-10-CM | POA: Diagnosis not present

## 2015-08-30 DIAGNOSIS — R5383 Other fatigue: Secondary | ICD-10-CM | POA: Diagnosis present

## 2015-08-30 LAB — URINALYSIS COMPLETE WITH MICROSCOPIC (ARMC ONLY)
BILIRUBIN URINE: NEGATIVE
Glucose, UA: NEGATIVE mg/dL
Nitrite: NEGATIVE
PH: 5 (ref 5.0–8.0)
Protein, ur: 30 mg/dL — AB
SPECIFIC GRAVITY, URINE: 1.027 (ref 1.005–1.030)

## 2015-08-30 LAB — BASIC METABOLIC PANEL
ANION GAP: 11 (ref 5–15)
BUN: 17 mg/dL (ref 6–20)
CALCIUM: 9.5 mg/dL (ref 8.9–10.3)
CHLORIDE: 103 mmol/L (ref 101–111)
CO2: 24 mmol/L (ref 22–32)
Creatinine, Ser: 0.78 mg/dL (ref 0.44–1.00)
GFR calc non Af Amer: >60 mL/min (ref >60)
Glucose, Bld: 91 mg/dL (ref 65–99)
Potassium: 3.6 mmol/L (ref 3.5–5.1)
Sodium: 138 mmol/L (ref 135–145)

## 2015-08-30 LAB — TSH: TSH: 0.646 u[IU]/mL (ref 0.350–4.500)

## 2015-08-30 LAB — CBC
HCT: 40.8 % (ref 35.0–47.0)
HEMOGLOBIN: 13.9 g/dL (ref 12.0–16.0)
MCH: 33.3 pg (ref 26.0–34.0)
MCHC: 34.1 g/dL (ref 32.0–36.0)
MCV: 97.6 fL (ref 80.0–100.0)
Platelets: 246 10*3/uL (ref 150–440)
RBC: 4.18 MIL/uL (ref 3.80–5.20)
RDW: 12.9 % (ref 11.5–14.5)
WBC: 7.3 10*3/uL (ref 3.6–11.0)

## 2015-08-30 LAB — TROPONIN I

## 2015-08-30 MED ORDER — CLONIDINE HCL 0.1 MG PO TABS
0.1000 mg | ORAL_TABLET | Freq: Once | ORAL | Status: AC
Start: 2015-08-30 — End: 2015-08-30
  Administered 2015-08-30: 0.1 mg via ORAL
  Filled 2015-08-30: qty 1

## 2015-08-30 MED ORDER — CLONIDINE HCL 0.1 MG PO TABS: 0.1000 mg | ORAL_TABLET | Freq: Two times a day (BID) | ORAL | Status: DC

## 2015-08-30 NOTE — ED Notes (Addendum)
Pt states she thinks her B/P is elevated, generalized fatigue with intermittent periods of sweat over the past 2 days. Pt states she was Rx b/p medication but has not taken it in a very long time.

## 2015-08-30 NOTE — ED Provider Notes (Signed)
Time Seen: Approximately 1500  I have reviewed the triage notes  Chief Complaint: Fatigue and Excessive Sweating   History of Present Illness: Catherine Richardson is a 50 y.o. female *who presents with some generalized fatigue and some intermittent sweating with light activity over the past 2 days. She denies any other symptoms like chest pain or lightheadedness or arm or jaw pain. She denies any nausea, vomiting. She denies any focal weakness in either upper or lower extremities. She denies any peripheral edema, calf tenderness or swelling.   Past Medical History  Diagnosis Date  . Hypertension     Patient Active Problem List   Diagnosis Date Noted  . Chest pain 12/06/2014  . Dyspnea   . HTN (hypertension), malignant   . Cocaine abuse   . ETOH abuse   . Orthostatic hypotension     History reviewed. No pertinent past surgical history.  History reviewed. No pertinent past surgical history.  Current Outpatient Rx  Name  Route  Sig  Dispense  Refill  . cyclobenzaprine (FLEXERIL) 5 MG tablet   Oral   Take 1 tablet (5 mg total) by mouth every 8 (eight) hours as needed for muscle spasms.   20 tablet   0   . ibuprofen (ADVIL,MOTRIN) 600 MG tablet   Oral   Take 1 tablet (600 mg total) by mouth every 6 (six) hours as needed.   30 tablet   0   . lisinopril-hydrochlorothiazide (PRINZIDE,ZESTORETIC) 10-12.5 MG tablet   Oral   Take 1 tablet by mouth daily.   60 tablet   1     Allergies:  Review of patient's allergies indicates no known allergies.  Family History: Family History  Problem Relation Age of Onset  . CAD Mother   . CAD Father   . Deep vein thrombosis Brother     Social History: Social History  Substance Use Topics  . Smoking status: Never Smoker   . Smokeless tobacco: None  . Alcohol Use: 1.8 oz/week    3 Shots of liquor per week     Review of Systems:   10 point review of systems was performed and was otherwise negative:  Constitutional: No  fever Eyes: No visual disturbances ENT: No sore throat, ear pain Cardiac: No chest pain Respiratory: No shortness of breath, wheezing, or stridor Abdomen: No abdominal pain, no vomiting, No diarrhea Endocrine: No weight loss, No night sweats Extremities: No peripheral edema, cyanosis Skin: No rashes, easy bruising Neurologic: No focal weakness, trouble with speech or swollowing Urologic: No dysuria, Hematuria, or urinary frequency   Physical Exam:  ED Triage Vitals  Enc Vitals Group     BP 08/30/15 1341 201/98 mmHg     Pulse Rate 08/30/15 1341 105     Resp 08/30/15 1341 16     Temp 08/30/15 1341 98.1 F (36.7 C)     Temp Source 08/30/15 1341 Oral     SpO2 08/30/15 1341 99 %     Weight 08/30/15 1341 163 lb (73.936 kg)     Height 08/30/15 1341 5\' 7"  (1.702 m)     Head Cir --      Peak Flow --      Pain Score --      Pain Loc --      Pain Edu? --      Excl. in [place]? --     General: Awake , Alert , and Oriented times 3; GCS 15 Head: Normal cephalic , atraumatic Eyes: Pupils  equal , round, reactive to light Nose/Throat: No nasal drainage, patent upper airway without erythema or exudate.  Neck: Supple, Full range of motion, No anterior adenopathy or palpable thyroid masses Lungs: Clear to ascultation without wheezes , rhonchi, or rales Heart: Regular rate, regular rhythm without murmurs , gallops , or rubs Abdomen: Soft, non tender without rebound, guarding , or rigidity; bowel sounds positive and symmetric in all 4 quadrants. No organomegaly .        Extremities: 2 plus symmetric pulses. No edema, clubbing or cyanosis Neurologic: normal ambulation, Motor symmetric without deficits, sensory intact Skin: warm, dry, no rashes   Labs:   All laboratory work was reviewed including any pertinent negatives or positives listed below:  Labs Reviewed  BASIC METABOLIC PANEL  CBC  TSH  URINALYSIS COMPLETEWITH MICROSCOPIC ([place])  T4  TROPONIN I   Laboratory work was  reviewed and showed no clinically significant abnormalities.  EKG:  ED ECG REPORT I, Daymon Larsen, the attending physician, personally viewed and interpreted this ECG.  Date: 08/30/2015 EKG Time: *1347 Rate: 106 Rhythm: normal sinus rhythm QRS Axis: normal Intervals: normal ST/T Wave abnormalities: normal Conduction Disturbances: none Narrative Interpretation: unremarkable No acute ischemic changes     ED Course: * Patient's stay was uneventful and patient remained hemodynamically stable and her blood pressure improved with oral clonidine here in emergency department. The patient otherwise appears within normal limits at this time and doesn't appear to have any end organ findings consistent with her hypertension. Patient had some numbness that seems to have corrected. I felt her diaphoresis was unlikely to be cardiogenic in nature   Assessment:  Hypertension      Plan:  Outpatient Clonidine Patient was advised to return immediately if condition worsens. Patient was advised to follow up with their primary care physician or other specialized physicians involved in their outpatient care. The patient and/or family member/power of attorney had laboratory results reviewed at the bedside. All questions and concerns were addressed and appropriate discharge instructions were distributed by the nursing staff.             Daymon Larsen, MD 08/30/15 [phone]

## 2015-08-30 NOTE — ED Notes (Signed)

## 2015-08-30 NOTE — Discharge Instructions (Signed)
Hypertension Hypertension, commonly called high blood pressure, is when the force of blood pumping through your arteries is too strong. Your arteries are the blood vessels that carry blood from your heart throughout your body. A blood pressure reading consists of a higher number over a lower number, such as 110/72. The higher number (systolic) is the pressure inside your arteries when your heart pumps. The lower number (diastolic) is the pressure inside your arteries when your heart relaxes. Ideally you want your blood pressure below 120/80. Hypertension forces your heart to work harder to pump blood. Your arteries may become narrow or stiff. Having untreated or uncontrolled hypertension can cause heart attack, stroke, kidney disease, and other problems. RISK FACTORS Some risk factors for high blood pressure are controllable. Others are not.  Risk factors you cannot control include:   Race. You may be at higher risk if you are African American.  Age. Risk increases with age.  Gender. Men are at higher risk than women before age 45 years. After age 65, women are at higher risk than men. Risk factors you can control include:  Not getting enough exercise or physical activity.  Being overweight.  Getting too much fat, sugar, calories, or salt in your diet.  Drinking too much alcohol. SIGNS AND SYMPTOMS Hypertension does not usually cause signs or symptoms. Extremely high blood pressure (hypertensive crisis) may cause headache, anxiety, shortness of breath, and nosebleed. DIAGNOSIS To check if you have hypertension, your health care provider will measure your blood pressure while you are seated, with your arm held at the level of your heart. It should be measured at least twice using the same arm. Certain conditions can cause a difference in blood pressure between your right and left arms. A blood pressure reading that is higher than normal on one occasion does not mean that you need treatment. If  it is not clear whether you have high blood pressure, you may be asked to return on a different day to have your blood pressure checked again. Or, you may be asked to monitor your blood pressure at home for 1 or more weeks. TREATMENT Treating high blood pressure includes making lifestyle changes and possibly taking medicine. Living a healthy lifestyle can help lower high blood pressure. You may need to change some of your habits. Lifestyle changes may include:  Following the DASH diet. This diet is high in fruits, vegetables, and whole grains. It is low in salt, red meat, and added sugars.  Keep your sodium intake below 2,300 mg per day.  Getting at least 30-45 minutes of aerobic exercise at least 4 times per week.  Losing weight if necessary.  Not smoking.  Limiting alcoholic beverages.  Learning ways to reduce stress. Your health care provider may prescribe medicine if lifestyle changes are not enough to get your blood pressure under control, and if one of the following is true:  You are 18-59 years of age and your systolic blood pressure is above 140.  You are 60 years of age or older, and your systolic blood pressure is above 150.  Your diastolic blood pressure is above 90.  You have diabetes, and your systolic blood pressure is over 140 or your diastolic blood pressure is over 90.  You have kidney disease and your blood pressure is above 140/90.  You have heart disease and your blood pressure is above 140/90. Your personal target blood pressure may vary depending on your medical conditions, your age, and other factors. HOME CARE INSTRUCTIONS    Have your blood pressure rechecked as directed by your health care provider.   Take medicines only as directed by your health care provider. Follow the directions carefully. Blood pressure medicines must be taken as prescribed. The medicine does not work as well when you skip doses. Skipping doses also puts you at risk for  problems.  Do not smoke.   Monitor your blood pressure at home as directed by your health care provider. SEEK MEDICAL CARE IF:   You think you are having a reaction to medicines taken.  You have recurrent headaches or feel dizzy.  You have swelling in your ankles.  You have trouble with your vision. SEEK IMMEDIATE MEDICAL CARE IF:  You develop a severe headache or confusion.  You have unusual weakness, numbness, or feel faint.  You have severe chest or abdominal pain.  You vomit repeatedly.  You have trouble breathing. MAKE SURE YOU:   Understand these instructions.  Will watch your condition.  Will get help right away if you are not doing well or get worse.   This information is not intended to replace advice given to you by your health care provider. Make sure you discuss any questions you have with your health care provider.   Document Released: 02/10/2005 Document Revised: 06/27/2014 Document Reviewed: 12/03/2012 Elsevier Interactive Patient Education [employer].   Please return immediately if condition worsens. Please contact her primary physician or the physician you were given for referral. If you have any specialist physicians involved in her treatment and plan please also contact them. Thank you for using [facility].

## 2015-08-31 LAB — T4: T4, Total: 6.4 ug/dL (ref 4.5–12.0)

## 2015-09-10 ENCOUNTER — Encounter: Payer: Self-pay | Admitting: *Deleted

## 2015-09-10 ENCOUNTER — Emergency Department
Admission: EM | Admit: 2015-09-10 | Discharge: 2015-09-10 | Disposition: A | Discharge status: Home / Self Care | Payer: Medicaid Other | Attending: Emergency Medicine | Admitting: Emergency Medicine

## 2015-09-10 DIAGNOSIS — Y93F2 Activity, caregiving, lifting: Secondary | ICD-10-CM | POA: Insufficient documentation

## 2015-09-10 DIAGNOSIS — F1410 Cocaine abuse, uncomplicated: Secondary | ICD-10-CM | POA: Insufficient documentation

## 2015-09-10 DIAGNOSIS — Y999 Unspecified external cause status: Secondary | ICD-10-CM | POA: Diagnosis not present

## 2015-09-10 DIAGNOSIS — S39012A Strain of muscle, fascia and tendon of lower back, initial encounter: Secondary | ICD-10-CM | POA: Insufficient documentation

## 2015-09-10 DIAGNOSIS — Z79899 Other long term (current) drug therapy: Secondary | ICD-10-CM | POA: Diagnosis not present

## 2015-09-10 DIAGNOSIS — Y929 Unspecified place or not applicable: Secondary | ICD-10-CM | POA: Insufficient documentation

## 2015-09-10 DIAGNOSIS — X500XXA Overexertion from strenuous movement or load, initial encounter: Secondary | ICD-10-CM | POA: Diagnosis not present

## 2015-09-10 DIAGNOSIS — I10 Essential (primary) hypertension: Secondary | ICD-10-CM | POA: Diagnosis not present

## 2015-09-10 DIAGNOSIS — M545 Low back pain: Secondary | ICD-10-CM | POA: Diagnosis present

## 2015-09-10 MED ORDER — IBUPROFEN 600 MG PO TABS: 600.0000 mg | ORAL_TABLET | Freq: Three times a day (TID) | ORAL | Status: DC | PRN

## 2015-09-10 MED ORDER — TRAMADOL HCL 50 MG PO TABS: 50.0000 mg | ORAL_TABLET | Freq: Four times a day (QID) | ORAL | Status: DC | PRN

## 2015-09-10 MED ORDER — METHOCARBAMOL 750 MG PO TABS: 750.0000 mg | ORAL_TABLET | Freq: Four times a day (QID) | ORAL | Status: DC

## 2015-09-10 NOTE — ED Notes (Signed)
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

## 2015-09-10 NOTE — ED Provider Notes (Signed)
[facility] Emergency Department Provider Note   ____________________________________________  Time seen: Approximately 2:08 PM  I have reviewed the triage vital signs and the nursing notes.   HISTORY  Chief Complaint Back Pain    HPI Catherine Richardson is a 50 y.o. female patient complaining of right lateral back pain secondary to heavy lifting incident this morning. 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 Patient slipped she tried to catch the patient and strained her right back. Patient denies any radicular component to this pain she denies any bladder or bowel dysfunction. No palliative measures taken for this complaint. Patient rates the pain as 8/10. Patient described the pain as episodes of spasms.  Past Medical History  Diagnosis Date  . Hypertension     Patient Active Problem List   Diagnosis Date Noted  . Chest pain 12/06/2014  . Dyspnea   . HTN (hypertension), malignant   . Cocaine abuse   . ETOH abuse   . Orthostatic hypotension     History reviewed. No pertinent past surgical history.  Current Outpatient Rx  Name  Route  Sig  Dispense  Refill  . cloNIDine (CATAPRES) 0.1 MG tablet   Oral   Take 1 tablet (0.1 mg total) by mouth 2 (two) times daily.   60 tablet   1   . cyclobenzaprine (FLEXERIL) 5 MG tablet   Oral   Take 1 tablet (5 mg total) by mouth every 8 (eight) hours as needed for muscle spasms.   20 tablet   0   . ibuprofen (ADVIL,MOTRIN) 600 MG tablet   Oral   Take 1 tablet (600 mg total) by mouth every 6 (six) hours as needed.   30 tablet   0   . ibuprofen (ADVIL,MOTRIN) 600 MG tablet   Oral   Take 1 tablet (600 mg total) by mouth every 8 (eight) hours as needed.   15 tablet   0   . lisinopril-hydrochlorothiazide (PRINZIDE,ZESTORETIC) 10-12.5 MG tablet   Oral   Take 1 tablet by mouth daily.   60 tablet   1   . methocarbamol (ROBAXIN-750) 750 MG tablet    Oral   Take 1 tablet (750 mg total) by mouth 4 (four) times daily.   20 tablet   0   . traMADol (ULTRAM) 50 MG tablet   Oral   Take 1 tablet (50 mg total) by mouth every 6 (six) hours as needed.   20 tablet   0     Allergies Review of patient's allergies indicates no known allergies.  Family History  Problem Relation Age of Onset  . CAD Mother   . CAD Father   . Deep vein thrombosis Brother     Social History Social History  Substance Use Topics  . Smoking status: Never Smoker   . Smokeless tobacco: None  . Alcohol Use: 1.8 oz/week    3 Shots of liquor per week    Review of Systems Constitutional: No fever/chills Eyes: No visual changes. ENT: No sore throat. Cardiovascular: Denies chest pain. Respiratory: Denies shortness of breath. Gastrointestinal: No abdominal pain.  No nausea, no vomiting.  No diarrhea.  No constipation. Genitourinary: Negative for dysuria. Musculoskeletal: Positive for back pain. Skin: Negative for rash. Neurological: Negative for headaches, focal weakness or numbness. Endocrine:Hypertension ____________________________________________   PHYSICAL EXAM:  VITAL SIGNS: ED Triage Vitals  Enc Vitals Group     BP 09/10/15 1336 175/95 mmHg  Pulse Rate 09/10/15 1336 97     Resp 09/10/15 1336 18     Temp 09/10/15 1336 98.2 F (36.8 C)     Temp Source 09/10/15 1336 Oral     SpO2 09/10/15 1336 98 %     Weight 09/10/15 1336 164 lb (74.39 kg)     Height 09/10/15 1336 5\' 7"  (1.702 m)     Head Cir --      Peak Flow --      Pain Score 09/10/15 1336 8     Pain Loc --      Pain Edu? --      Excl. in [place]? --     Constitutional: Alert and oriented. Well appearing and in no acute distress. Eyes: Conjunctivae are normal. PERRL. EOMI. Head: Atraumatic. Nose: No congestion/rhinnorhea. Mouth/Throat: Mucous membranes are moist.  Oropharynx non-erythematous. Neck: No stridor.  No cervical spine tenderness to  palpation. Hematological/Lymphatic/Immunilogical: No cervical lymphadenopathy. Cardiovascular: Normal rate, regular rhythm. Grossly normal heart sounds.  Good peripheral circulation. Respiratory: Normal respiratory effort.  No retractions. Lungs CTAB. Gastrointestinal: Soft and nontender. No distention. No abdominal bruits. No CVA tenderness. Musculoskeletal: No lower extremity tenderness nor edema.  No joint effusions. Neurologic:  Normal speech and language. No gross focal neurologic deficits are appreciated. No gait instability. Skin:  Skin is warm, dry and intact. No rash noted. Psychiatric: Mood and affect are normal. Speech and behavior are normal.  ____________________________________________   LABS (all labs ordered are listed, but only abnormal results are displayed)  Labs Reviewed - No data to display ____________________________________________  EKG   ____________________________________________  RADIOLOGY   ____________________________________________   PROCEDURES  Procedure(s) performed: None  Procedures  Critical Care performed: No  ____________________________________________   INITIAL IMPRESSION / ASSESSMENT AND PLAN / ED COURSE  Pertinent labs & imaging results that were available during my care of the patient were reviewed by me and considered in my medical decision making (see chart for details).  Acute lumbar strain. Patient given discharge care instructions. Patient get a prescription for tramadol, ibuprofen, Robaxin. Patient given a work note for 2 days. Patient advised to follow-up with her family doctor if condition persists. ____________________________________________   FINAL CLINICAL IMPRESSION(S) / ED DIAGNOSES  Final diagnoses:  Lumbar strain, initial encounter      NEW MEDICATIONS STARTED DURING THIS VISIT:  New Prescriptions   IBUPROFEN (ADVIL,MOTRIN) 600 MG TABLET    Take 1 tablet (600 mg total) by mouth every 8 (eight) hours  as needed.   METHOCARBAMOL (ROBAXIN-750) 750 MG TABLET    Take 1 tablet (750 mg total) by mouth 4 (four) times daily.   TRAMADOL (ULTRAM) 50 MG TABLET    Take 1 tablet (50 mg total) by mouth every 6 (six) hours as needed.     Note:  This document was prepared using Dragon voice recognition software and may include unintentional dictation errors.    Sable Feil, PA-C 09/10/15 1413  Lavonia Drafts, MD 09/10/15 [phone]

## 2015-10-22 ENCOUNTER — Emergency Department
Admission: EM | Admit: 2015-10-22 | Discharge: 2015-10-22 | Disposition: A | Discharge status: Home / Self Care | Payer: Medicaid Other | Attending: Emergency Medicine | Admitting: Emergency Medicine

## 2015-10-22 ENCOUNTER — Emergency Department: Payer: Medicaid Other

## 2015-10-22 ENCOUNTER — Encounter: Payer: Self-pay | Admitting: Emergency Medicine

## 2015-10-22 DIAGNOSIS — M545 Low back pain, unspecified: Secondary | ICD-10-CM

## 2015-10-22 DIAGNOSIS — I10 Essential (primary) hypertension: Secondary | ICD-10-CM | POA: Insufficient documentation

## 2015-10-22 LAB — BASIC METABOLIC PANEL
ANION GAP: 9 (ref 5–15)
BUN: 17 mg/dL (ref 6–20)
CALCIUM: 9.5 mg/dL (ref 8.9–10.3)
CO2: 24 mmol/L (ref 22–32)
Chloride: 103 mmol/L (ref 101–111)
Creatinine, Ser: 0.52 mg/dL (ref 0.44–1.00)
GLUCOSE: 139 mg/dL — AB (ref 65–99)
Potassium: 3.3 mmol/L — ABNORMAL LOW (ref 3.5–5.1)
Sodium: 136 mmol/L (ref 135–145)

## 2015-10-22 LAB — URINALYSIS COMPLETE WITH MICROSCOPIC (ARMC ONLY)
Bilirubin Urine: NEGATIVE
Glucose, UA: NEGATIVE mg/dL
Nitrite: NEGATIVE
PROTEIN: 30 mg/dL — AB
Specific Gravity, Urine: 1.025 (ref 1.005–1.030)
pH: 5 (ref 5.0–8.0)

## 2015-10-22 LAB — CBC
HCT: 36.8 % (ref 35.0–47.0)
Hemoglobin: 12.7 g/dL (ref 12.0–16.0)
MCH: 33.8 pg (ref 26.0–34.0)
MCHC: 34.6 g/dL (ref 32.0–36.0)
MCV: 97.6 fL (ref 80.0–100.0)
Platelets: 216 10*3/uL (ref 150–440)
RBC: 3.77 MIL/uL — ABNORMAL LOW (ref 3.80–5.20)
RDW: 12.8 % (ref 11.5–14.5)
WBC: 5.7 10*3/uL (ref 3.6–11.0)

## 2015-10-22 LAB — TROPONIN I

## 2015-10-22 MED ORDER — CYCLOBENZAPRINE HCL 5 MG PO TABS: 5.0000 mg | ORAL_TABLET | Freq: Three times a day (TID) | ORAL | 0 refills | Status: DC | PRN

## 2015-10-22 MED ORDER — KETOROLAC TROMETHAMINE 30 MG/ML IJ SOLN
30.0000 mg | Freq: Once | INTRAMUSCULAR | Status: AC
  Administered 2015-10-22: 30 mg via INTRAVENOUS

## 2015-10-22 MED ORDER — KETOROLAC TROMETHAMINE 30 MG/ML IJ SOLN
INTRAMUSCULAR | Status: AC
  Administered 2015-10-22: 30 mg via INTRAVENOUS
  Filled 2015-10-22: qty 1

## 2015-10-22 NOTE — Discharge Instructions (Signed)
Please seek medical attention for any high fevers, chest pain, shortness of breath, change in behavior, persistent vomiting, bloody stool or any other new or concerning symptoms.  

## 2015-10-22 NOTE — ED Provider Notes (Signed)
[facility] Emergency Department Provider Note   ____________________________________________   I have reviewed the triage vital signs and the nursing notes.   HISTORY  Chief Complaint Chest Pain and Back Pain   History limited by: Not Limited   HPI Catherine Richardson is a 50 y.o. female who presents to the emergency department today because of concerns for indigestion and right lower back pain. The patient states that both of the symptoms started today. 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 She thinks she might of her back doing this. She has strained her back in the past and has been seen in this emergency department in the past for. She states the pain is located in the right lower back. It does hurt with movement. She denies any dysuria or bloody urine. She denies any fevers. In addition she is complaining of burning sensation believe her left breast and thinks that it might be due to indigestion. She does states she has a bad diet.   Past Medical History:  Diagnosis Date  . Hypertension     Patient Active Problem List   Diagnosis Date Noted  . Chest pain 12/06/2014  . Dyspnea   . HTN (hypertension), malignant   . Cocaine abuse   . ETOH abuse   . Orthostatic hypotension     History reviewed. No pertinent surgical history.  Prior to Admission medications   Medication Sig Start Date End Date Taking? Authorizing Provider  cloNIDine (CATAPRES) 0.1 MG tablet Take 1 tablet (0.1 mg total) by mouth 2 (two) times daily. 08/30/15   Daymon Larsen, MD  cyclobenzaprine (FLEXERIL) 5 MG tablet Take 1 tablet (5 mg total) by mouth every 8 (eight) hours as needed for muscle spasms. 04/17/15 04/16/16  Harvest Dark, MD  ibuprofen (ADVIL,MOTRIN) 600 MG tablet Take 1 tablet (600 mg total) by mouth every 6 (six) hours as needed. 04/17/15   Harvest Dark, MD  ibuprofen (ADVIL,MOTRIN) 600 MG tablet Take 1 tablet (600 mg total)  by mouth every 8 (eight) hours as needed. 09/10/15   Sable Feil, PA-C  lisinopril-hydrochlorothiazide (PRINZIDE,ZESTORETIC) 10-12.5 MG tablet Take 1 tablet by mouth daily. 12/07/14   Henreitta Leber, MD  methocarbamol (ROBAXIN-750) 750 MG tablet Take 1 tablet (750 mg total) by mouth 4 (four) times daily. 09/10/15   Sable Feil, PA-C  traMADol (ULTRAM) 50 MG tablet Take 1 tablet (50 mg total) by mouth every 6 (six) hours as needed. 09/10/15 09/09/16  Sable Feil, PA-C    Allergies Review of patient's allergies indicates no known allergies.  Family History  Problem Relation Age of Onset  . CAD Mother   . CAD Father   . Deep vein thrombosis Brother     Social History Social History  Substance Use Topics  . Smoking status: Never Smoker  . Smokeless tobacco: Never Used  . Alcohol use 1.8 oz/week    3 Shots of liquor per week    Review of Systems  Constitutional: Negative for fever. Cardiovascular: Positive for pain below her left breast Respiratory: Negative for shortness of breath. Gastrointestinal: Negative for abdominal pain, vomiting and diarrhea. Neurological: Negative for headaches, focal weakness or numbness.  10-point ROS otherwise negative.  ____________________________________________   PHYSICAL EXAM:  VITAL SIGNS: ED Triage Vitals  Enc Vitals Group     BP 10/22/15 1017 (!) 163/79     Pulse Rate 10/22/15 1017 (!) 102     Resp  10/22/15 1017 18     Temp 10/22/15 1017 98.5 F (36.9 C)     Temp Source 10/22/15 1017 Oral     SpO2 10/22/15 1017 98 %     Weight 10/22/15 1018 157 lb (71.2 kg)     Height 10/22/15 1018 5\' 8"  (1.727 m)     Head Circumference --      Peak Flow --      Pain Score 10/22/15 1024 9   Constitutional: Alert and oriented. Well appearing and in no distress. Eyes: Conjunctivae are normal. PERRL. Normal extraocular movements. ENT   Head: Normocephalic and atraumatic.   Nose: No congestion/rhinnorhea.   Mouth/Throat: Mucous  membranes are moist.   Neck: No stridor. Hematological/Lymphatic/Immunilogical: No cervical lymphadenopathy. Cardiovascular: Normal rate, regular rhythm.  No murmurs, rubs, or gallops. Respiratory: Normal respiratory effort without tachypnea nor retractions. Breath sounds are clear and equal bilaterally. No wheezes/rales/rhonchi. Gastrointestinal: Soft and nontender. No distention. Genitourinary: Deferred Musculoskeletal: Normal range of motion in all extremities. No joint effusions.  No lower extremity tenderness nor edema. Tenderness to palpation in the right lower back.  Neurologic:  Normal speech and language. No gross focal neurologic deficits are appreciated.  Skin:  Skin is warm, dry and intact. No rash noted. Psychiatric: Mood and affect are normal. Speech and behavior are normal. Patient exhibits appropriate insight and judgment.  ____________________________________________    LABS (pertinent positives/negatives)  Labs Reviewed  BASIC METABOLIC PANEL - Abnormal; Notable for the following:       Result Value   Potassium 3.3 (*)    Glucose, Bld 139 (*)    All other components within normal limits  CBC - Abnormal; Notable for the following:    RBC 3.77 (*)    All other components within normal limits  TROPONIN I    ____________________________________________   EKG  I, Nance Pear, attending physician, personally viewed and interpreted this EKG  EKG Time: 1021 Rate: 94 Rhythm: normal sinus rhythm Axis: normal Intervals: qtc 450 QRS: narrow ST changes: no st elevation Impression: normal ekg   ____________________________________________    RADIOLOGY  CXR IMPRESSION:  No active cardiopulmonary disease.      ____________________________________________   PROCEDURES  Procedures  ____________________________________________   INITIAL IMPRESSION / ASSESSMENT AND PLAN / ED COURSE  Pertinent labs & imaging results that were available during  my care of the patient were reviewed by me and considered in my medical decision making (see chart for details).  Patient presented to the emergency department today because of concern for right lower back pain. Work up without concerning findings. Will discharge with muscle relaxer. ____________________________________________   FINAL CLINICAL IMPRESSION(S) / ED DIAGNOSES  Final diagnoses:  Right-sided low back pain without sciatica     Note: This dictation was prepared with Dragon dictation. Any transcriptional errors that result from this process are unintentional    Nance Pear, MD 10/22/15 1414

## 2015-10-22 NOTE — ED Triage Notes (Signed)
Pt presents with lower back pain and chest pain started last night.

## 2016-03-25 ENCOUNTER — Emergency Department
Admission: EM | Admit: 2016-03-25 | Discharge: 2016-03-25 | Disposition: A | Discharge status: Home / Self Care | Payer: Medicaid Other | Attending: Emergency Medicine | Admitting: Emergency Medicine

## 2016-03-25 ENCOUNTER — Encounter: Payer: Self-pay | Admitting: Emergency Medicine

## 2016-03-25 DIAGNOSIS — S76211A Strain of adductor muscle, fascia and tendon of right thigh, initial encounter: Secondary | ICD-10-CM

## 2016-03-25 DIAGNOSIS — Y929 Unspecified place or not applicable: Secondary | ICD-10-CM | POA: Insufficient documentation

## 2016-03-25 DIAGNOSIS — S79921A Unspecified injury of right thigh, initial encounter: Secondary | ICD-10-CM | POA: Diagnosis present

## 2016-03-25 DIAGNOSIS — Z79899 Other long term (current) drug therapy: Secondary | ICD-10-CM | POA: Insufficient documentation

## 2016-03-25 DIAGNOSIS — X509XXA Other and unspecified overexertion or strenuous movements or postures, initial encounter: Secondary | ICD-10-CM | POA: Diagnosis not present

## 2016-03-25 DIAGNOSIS — I10 Essential (primary) hypertension: Secondary | ICD-10-CM | POA: Insufficient documentation

## 2016-03-25 DIAGNOSIS — Y999 Unspecified external cause status: Secondary | ICD-10-CM | POA: Diagnosis not present

## 2016-03-25 DIAGNOSIS — S76911A Strain of unspecified muscles, fascia and tendons at thigh level, right thigh, initial encounter: Secondary | ICD-10-CM | POA: Diagnosis not present

## 2016-03-25 DIAGNOSIS — Y9301 Activity, walking, marching and hiking: Secondary | ICD-10-CM | POA: Diagnosis not present

## 2016-03-25 MED ORDER — IBUPROFEN 600 MG PO TABS: 600.0000 mg | ORAL_TABLET | Freq: Three times a day (TID) | ORAL | 0 refills | Status: DC | PRN

## 2016-03-25 MED ORDER — KETOROLAC TROMETHAMINE 30 MG/ML IJ SOLN
30.0000 mg | Freq: Once | INTRAMUSCULAR | Status: AC
  Administered 2016-03-25: 30 mg via INTRAMUSCULAR
  Filled 2016-03-25: qty 1

## 2016-03-25 MED ORDER — HYDROCODONE-ACETAMINOPHEN 5-325 MG PO TABS: 1.0000 | ORAL_TABLET | ORAL | 0 refills | Status: DC | PRN

## 2016-03-25 MED ORDER — DIAZEPAM 5 MG PO TABS
5.0000 mg | ORAL_TABLET | Freq: Once | ORAL | Status: AC
  Administered 2016-03-25: 5 mg via ORAL
  Filled 2016-03-25: qty 1

## 2016-03-25 MED ORDER — HYDROCODONE-ACETAMINOPHEN 5-325 MG PO TABS
2.0000 | ORAL_TABLET | Freq: Once | ORAL | Status: AC
  Administered 2016-03-25: 2 via ORAL
  Filled 2016-03-25: qty 2

## 2016-03-25 MED ORDER — DIAZEPAM 2 MG PO TABS: 2.0000 mg | ORAL_TABLET | Freq: Three times a day (TID) | ORAL | 0 refills | Status: DC | PRN

## 2016-03-25 NOTE — Discharge Instructions (Signed)
Follow-up with one of the clinics listed on your paperwork. Please clinics charge on a sliding scale. You will need to have her blood pressure rechecked as it was elevated in the emergency room. Take medication as directed. Norco as needed for pain. Diazepam 2 mg 1 tablet 3 times a day for the next 3 days as needed for muscle spasms. ibuprofen 600 mg 3 times a day with food. You may use ice or heat to your leg as needed for comfort.

## 2016-03-25 NOTE — ED Provider Notes (Signed)
[facility] Emergency Department Provider Note  ____________________________________________   First MD Initiated Contact with Patient 03/25/16 1151     (approximate)  I have reviewed the triage vital signs and the nursing notes.   HISTORY  Chief Complaint Leg Pain   HPI Catherine Richardson is a 51 y.o. female here complaining of right upper leg pain such as a. Patient states that she was walking through the house yesterday when she felt a "twinge" in her upper leg. She denies any fall or anything hitting her. She states that she has not had problems with her leg in the past. She denies any recent injury, travel, history of DVT. Patient states morning when she woke that she had increased pain with walking. She has not taken any over-the-counter medication for this. Probably she rates her pain as a 10 over 10.   Past Medical History:  Diagnosis Date  . Hypertension     Patient Active Problem List   Diagnosis Date Noted  . Chest pain 12/06/2014  . Dyspnea   . HTN (hypertension), malignant   . Cocaine abuse   . ETOH abuse   . Orthostatic hypotension     History reviewed. No pertinent surgical history.  Prior to Admission medications   Medication Sig Start Date End Date Taking? Authorizing Provider  cloNIDine (CATAPRES) 0.1 MG tablet Take 1 tablet (0.1 mg total) by mouth 2 (two) times daily. 08/30/15   Daymon Larsen, MD  diazepam (VALIUM) 2 MG tablet Take 1 tablet (2 mg total) by mouth every 8 (eight) hours as needed for muscle spasms. 03/25/16   Johnn Hai, PA-C  HYDROcodone-acetaminophen (NORCO/VICODIN) 5-325 MG tablet Take 1 tablet by mouth every 4 (four) hours as needed for moderate pain. 03/25/16   Johnn Hai, PA-C  ibuprofen (ADVIL,MOTRIN) 600 MG tablet Take 1 tablet (600 mg total) by mouth every 8 (eight) hours as needed. 03/25/16   Johnn Hai, PA-C  lisinopril-hydrochlorothiazide (PRINZIDE,ZESTORETIC) 10-12.5 MG tablet Take 1  tablet by mouth daily. 12/07/14   Henreitta Leber, MD    Allergies Patient has no known allergies.  Family History  Problem Relation Age of Onset  . CAD Mother   . CAD Father   . Deep vein thrombosis Brother     Social History Social History  Substance Use Topics  . Smoking status: Never Smoker  . Smokeless tobacco: Never Used  . Alcohol use 6.0 oz/week    3 Shots of liquor, 7 Cans of beer per week    Review of Systems Constitutional: No fever/chills ENT: No sore throat. Cardiovascular: Denies chest pain. Respiratory: Denies shortness of breath. Gastrointestinal:  No nausea, no vomiting.   Musculoskeletal: As a for right upper leg pain. Skin: Negative for rash. Neurological: Negative for headaches, focal weakness or numbness.  10-point ROS otherwise negative.  ____________________________________________   PHYSICAL EXAM:  VITAL SIGNS: ED Triage Vitals  Enc Vitals Group     BP 03/25/16 1119 (!) 173/89     Pulse Rate 03/25/16 1119 92     Resp 03/25/16 1119 18     Temp 03/25/16 1119 98.1 F (36.7 C)     Temp Source 03/25/16 1119 Oral     SpO2 03/25/16 1119 97 %     Weight 03/25/16 1119 152 lb (68.9 kg)     Height 03/25/16 1119 5\' 8"  (1.727 m)     Head Circumference --      Peak Flow --  Pain Score 03/25/16 1123 10     Pain Loc --      Pain Edu? --      Excl. in [place]? --     Constitutional: Alert and oriented. Well appearing and in no acute distress. Eyes: Conjunctivae are normal. PERRL. EOMI. Head: Atraumatic. Nose: No congestion/rhinnorhea. Mouth/Throat: Mucous membranes are moist.  Oropharynx non-erythematous. Neck: No stridor.   Cardiovascular: Normal rate, regular rhythm. Grossly normal heart sounds.  Good peripheral circulation. Respiratory: Normal respiratory effort.  No retractions. Lungs CTAB. Musculoskeletal: Examination of the right thigh there is no gross deformity and no appreciated edema in relationship to her left leg. There is no  ecchymosis or erythema present. There is no tenderness on palpation of soft tissue. Range of motion with abduction and abduction increases patient's pain. She is actually tender in the groin area with range of motion. Patient has difficulty with standing secondary to her pain and also weightbearing. Neurologic:  Normal speech and language. No gross focal neurologic deficits are appreciated.  Skin:  Skin is warm, dry and intact. No abrasions, ecchymosis or erythema was noted. Psychiatric: Mood and affect are normal. Speech and behavior are normal.  ____________________________________________   LABS (all labs ordered are listed, but only abnormal results are displayed)  Labs Reviewed - No data to display  PROCEDURES  Procedure(s) performed: None  Procedures  Critical Care performed: No  ____________________________________________   INITIAL IMPRESSION / ASSESSMENT AND PLAN / ED COURSE  Pertinent labs & imaging results that were available during my care of the patient were reviewed by me and considered in my medical decision making (see chart for details).  Patient was given Toradol 30 mg IM along with Norco 2 tablets by mouth and last p.m. 5 mg by mouth. Patient got near complete relief and was able to walk around the room without severe pain. Patient states she is much improved and would like to go home. She agrees with the treatment plan of continuing medication. Patient was discharged on ibuprofen 600 mg, diazepam 2 mg 3 times a day for the next 3 days as needed for muscle spasms and Norco every 4 hours if needed for pain. Patient is to follow-up with her PCP worker no clinic if any continued problems.      ____________________________________________   FINAL CLINICAL IMPRESSION(S) / ED DIAGNOSES  Final diagnoses:  Groin strain, right, initial encounter      NEW MEDICATIONS STARTED DURING THIS VISIT:  Discharge Medication List as of 03/25/2016  1:29 PM    START taking  these medications   Details  diazepam (VALIUM) 2 MG tablet Take 1 tablet (2 mg total) by mouth every 8 (eight) hours as needed for muscle spasms., Starting Tue 03/25/2016, Print    HYDROcodone-acetaminophen (NORCO/VICODIN) 5-325 MG tablet Take 1 tablet by mouth every 4 (four) hours as needed for moderate pain., Starting Tue 03/25/2016, Print         Note:  This document was prepared using Dragon voice recognition software and may include unintentional dictation errors.    Johnn Hai, PA-C 03/25/16 1828    Lavonia Drafts, MD 03/31/16 [phone]

## 2016-03-25 NOTE — ED Triage Notes (Signed)
Pt in via POV with complaints of right leg pain since yesterday.  Pt denies any recent injury, denies any OTC meds.

## 2016-04-11 ENCOUNTER — Encounter: Payer: Self-pay | Admitting: Emergency Medicine

## 2016-04-11 ENCOUNTER — Emergency Department
Admission: EM | Admit: 2016-04-11 | Discharge: 2016-04-11 | Disposition: A | Discharge status: Home / Self Care | Payer: Self-pay | Attending: Emergency Medicine | Admitting: Emergency Medicine

## 2016-04-11 DIAGNOSIS — I10 Essential (primary) hypertension: Secondary | ICD-10-CM | POA: Insufficient documentation

## 2016-04-11 DIAGNOSIS — T783XXA Angioneurotic edema, initial encounter: Secondary | ICD-10-CM | POA: Insufficient documentation

## 2016-04-11 DIAGNOSIS — Z79899 Other long term (current) drug therapy: Secondary | ICD-10-CM | POA: Insufficient documentation

## 2016-04-11 DIAGNOSIS — Z791 Long term (current) use of non-steroidal anti-inflammatories (NSAID): Secondary | ICD-10-CM | POA: Insufficient documentation

## 2016-04-11 LAB — CBC WITH DIFFERENTIAL/PLATELET
BASOS PCT: 1 %
Basophils Absolute: 0.1 10*3/uL (ref 0–0.1)
EOS ABS: 0.1 10*3/uL (ref 0–0.7)
Eosinophils Relative: 2 %
HEMATOCRIT: 38.1 % (ref 35.0–47.0)
Hemoglobin: 12.7 g/dL (ref 12.0–16.0)
Lymphocytes Relative: 48 %
Lymphs Abs: 2.4 10*3/uL (ref 1.0–3.6)
MCH: 32.5 pg (ref 26.0–34.0)
MCHC: 33.4 g/dL (ref 32.0–36.0)
MCV: 97.4 fL (ref 80.0–100.0)
MONO ABS: 0.6 10*3/uL (ref 0.2–0.9)
MONOS PCT: 11 %
Neutro Abs: 1.9 10*3/uL (ref 1.4–6.5)
Neutrophils Relative %: 38 %
Platelets: 251 10*3/uL (ref 150–440)
RBC: 3.91 MIL/uL (ref 3.80–5.20)
RDW: 12.8 % (ref 11.5–14.5)
WBC: 5 10*3/uL (ref 3.6–11.0)

## 2016-04-11 LAB — BASIC METABOLIC PANEL
Anion gap: 10 (ref 5–15)
BUN: 11 mg/dL (ref 6–20)
CO2: 27 mmol/L (ref 22–32)
CREATININE: 0.59 mg/dL (ref 0.44–1.00)
Calcium: 9.2 mg/dL (ref 8.9–10.3)
Chloride: 104 mmol/L (ref 101–111)
Glucose, Bld: 98 mg/dL (ref 65–99)
POTASSIUM: 3.7 mmol/L (ref 3.5–5.1)
SODIUM: 141 mmol/L (ref 135–145)

## 2016-04-11 MED ORDER — PREDNISONE 20 MG PO TABS: ORAL_TABLET | ORAL | 0 refills | Status: DC

## 2016-04-11 MED ORDER — METHYLPREDNISOLONE SODIUM SUCC 125 MG IJ SOLR
125.0000 mg | Freq: Once | INTRAMUSCULAR | Status: AC
  Administered 2016-04-11: 125 mg via INTRAVENOUS
  Filled 2016-04-11: qty 2

## 2016-04-11 MED ORDER — FAMOTIDINE IN NACL 20-0.9 MG/50ML-% IV SOLN
20.0000 mg | Freq: Once | INTRAVENOUS | Status: AC
  Administered 2016-04-11: 20 mg via INTRAVENOUS
  Filled 2016-04-11: qty 50

## 2016-04-11 MED ORDER — HYDROCHLOROTHIAZIDE 12.5 MG PO CAPS: 12.5000 mg | ORAL_CAPSULE | Freq: Every day | ORAL | 0 refills | Status: DC

## 2016-04-11 MED ORDER — METHYLPREDNISOLONE SODIUM SUCC 125 MG IJ SOLR
INTRAMUSCULAR | Status: AC
  Administered 2016-04-11: 125 mg via INTRAVENOUS
  Filled 2016-04-11: qty 2

## 2016-04-11 MED ORDER — DIPHENHYDRAMINE HCL 50 MG/ML IJ SOLN
25.0000 mg | Freq: Once | INTRAMUSCULAR | Status: AC
  Administered 2016-04-11: 25 mg via INTRAVENOUS
  Filled 2016-04-11: qty 1

## 2016-04-11 NOTE — Discharge Instructions (Signed)
Take prednisone as prescribed.   Stop taking lisinopril/   Continue HCTZ for your elevated blood pressure.   See your doctor in a week or recheck BP at a drug store  Return to ER if you have worse lip swelling, trouble breathing, trouble swallowing.

## 2016-04-11 NOTE — ED Triage Notes (Signed)
Patient presents to the ED with very swollen bottom lip.  Patient states, "I thought I might have an infection in my gums so I poured peroxide on it last night and then my lip swelled up like this.  Patient denies shortness of breath.  Patient is speaking in sentences with no obvious distress.  Patient is complaining of pain to lip.

## 2016-04-11 NOTE — ED Provider Notes (Signed)
[facility] Provider Note   CSN: [id number] Arrival date & time: 04/11/16  0808     History   Chief Complaint Chief Complaint  Patient presents with  . Oral Swelling    HPI Catherine Richardson is a 51 y.o. female hx of HTN on lisinopril (uncompliant) here with lower lip swelling,. Lower slip swelling since yesterday. Patient states that started last night and she tried some peroxide but didn't help. She states that the swelling has improved this morning but she still wanted to get checked out. Denies any trouble swallowing or denies any trouble breathing. She has been on lisinopril but last time she took it was about week ago.    The history is provided by the patient.    Past Medical History:  Diagnosis Date  . Hypertension     Patient Active Problem List   Diagnosis Date Noted  . Chest pain 12/06/2014  . Dyspnea   . HTN (hypertension), malignant   . Cocaine abuse   . ETOH abuse   . Orthostatic hypotension     History reviewed. No pertinent surgical history.  OB History    No data available       Home Medications    Prior to Admission medications   Medication Sig Start Date End Date Taking? Authorizing Provider  acetaminophen (TYLENOL) 500 MG tablet Take 1,000 mg by mouth every 6 (six) hours as needed.   Yes Historical Provider, MD  cloNIDine (CATAPRES) 0.1 MG tablet Take 1 tablet (0.1 mg total) by mouth 2 (two) times daily. Patient not taking: Reported on 04/11/2016 08/30/15   Daymon Larsen, MD  diazepam (VALIUM) 2 MG tablet Take 1 tablet (2 mg total) by mouth every 8 (eight) hours as needed for muscle spasms. Patient not taking: Reported on 04/11/2016 03/25/16   Johnn Hai, PA-C  HYDROcodone-acetaminophen (NORCO/VICODIN) 5-325 MG tablet Take 1 tablet by mouth every 4 (four) hours as needed for moderate pain. Patient not taking: Reported on 04/11/2016 03/25/16   Johnn Hai, PA-C  ibuprofen (ADVIL,MOTRIN) 600 MG tablet Take 1 tablet (600 mg  total) by mouth every 8 (eight) hours as needed. Patient not taking: Reported on 04/11/2016 03/25/16   Johnn Hai, PA-C  lisinopril-hydrochlorothiazide (PRINZIDE,ZESTORETIC) 10-12.5 MG tablet Take 1 tablet by mouth daily. Patient not taking: Reported on 04/11/2016 12/07/14   Henreitta Leber, MD    Family History Family History  Problem Relation Age of Onset  . CAD Mother   . CAD Father   . Deep vein thrombosis Brother     Social History Social History  Substance Use Topics  . Smoking status: Never Smoker  . Smokeless tobacco: Never Used  . Alcohol use 6.0 oz/week    3 Shots of liquor, 7 Cans of beer per week     Allergies   Patient has no known allergies.   Review of Systems Review of Systems  HENT:       Lower lip swelling   All other systems reviewed and are negative.    Physical Exam Updated Vital Signs BP (!) 159/99   Pulse 83   Temp 98.7 F (37.1 C) (Oral)   Resp 16   Wt 152 lb (68.9 kg)   SpO2 99%   BMI 23.11 kg/m   Physical Exam  Constitutional: She is oriented to person, place, and time. She appears well-developed.  HENT:  Head: Normocephalic.  Lower lip angioedema. Floor of mouth nontender. Posterior pharynx not swollen   Eyes:  EOM are normal. Pupils are equal, round, and reactive to light.  Neck:  No cervical LAD   Cardiovascular: Normal rate, regular rhythm and normal heart sounds.   Pulmonary/Chest: Effort normal and breath sounds normal. No respiratory distress. She has no wheezes.  Abdominal: Soft. Bowel sounds are normal. She exhibits no distension. There is no tenderness.  Musculoskeletal: Normal range of motion.  Neurological: She is alert and oriented to person, place, and time. No cranial nerve deficit.  Skin: Skin is warm.  Psychiatric: She has a normal mood and affect.  Nursing note and vitals reviewed.    ED Treatments / Results  Labs (all labs ordered are listed, but only abnormal results are displayed) Labs Reviewed    CBC WITH DIFFERENTIAL/PLATELET  BASIC METABOLIC PANEL    EKG  EKG Interpretation None       Radiology No results found.  Procedures Procedures (including critical care time)  Medications Ordered in ED Medications  methylPREDNISolone sodium succinate (SOLU-MEDROL) 125 mg/2 mL injection 125 mg (125 mg Intravenous Given 04/11/16 0852)  diphenhydrAMINE (BENADRYL) injection 25 mg (25 mg Intravenous Given 04/11/16 0848)  famotidine (PEPCID) IVPB 20 mg premix (20 mg Intravenous New Bag/Given 04/11/16 0853)     Initial Impression / Assessment and Plan / ED Course  I have reviewed the triage vital signs and the nursing notes.  Pertinent labs & imaging results that were available during my care of the patient were reviewed by me and considered in my medical decision making (see chart for details).     Catherine Richardson is a 51 y.o. female here lower lip angioedema. Stable for the last 12 hrs. She has been taking lisinopril intermittently. Will check labs, give steroids, benadryl, pepcid. Will observe.    10:03 AM Lip swelling stable, slightly improved. Likely angioedema from lisinopril. She is on the lisinopril/HCTZ combination and BP here in the 160s. Will take her off lisinopril, just put on HCTZ.   Final Clinical Impressions(s) / ED Diagnoses   Final diagnoses:  None    New Prescriptions New Prescriptions   No medications on file     Drenda Freeze, MD 04/11/16 1003

## 2016-04-22 ENCOUNTER — Emergency Department
Admission: EM | Admit: 2016-04-22 | Discharge: 2016-04-22 | Disposition: A | Discharge status: Home / Self Care | Payer: Self-pay | Attending: Emergency Medicine | Admitting: Emergency Medicine

## 2016-04-22 ENCOUNTER — Emergency Department: Payer: Self-pay

## 2016-04-22 DIAGNOSIS — Z79899 Other long term (current) drug therapy: Secondary | ICD-10-CM | POA: Insufficient documentation

## 2016-04-22 DIAGNOSIS — Z791 Long term (current) use of non-steroidal anti-inflammatories (NSAID): Secondary | ICD-10-CM | POA: Insufficient documentation

## 2016-04-22 DIAGNOSIS — I10 Essential (primary) hypertension: Secondary | ICD-10-CM | POA: Insufficient documentation

## 2016-04-22 DIAGNOSIS — M5440 Lumbago with sciatica, unspecified side: Secondary | ICD-10-CM | POA: Insufficient documentation

## 2016-04-22 DIAGNOSIS — G8929 Other chronic pain: Secondary | ICD-10-CM | POA: Insufficient documentation

## 2016-04-22 MED ORDER — KETOROLAC TROMETHAMINE 60 MG/2ML IM SOLN
30.0000 mg | Freq: Once | INTRAMUSCULAR | Status: AC
  Administered 2016-04-22: 30 mg via INTRAMUSCULAR
  Filled 2016-04-22: qty 2

## 2016-04-22 MED ORDER — CYCLOBENZAPRINE HCL 10 MG PO TABS
10.0000 mg | ORAL_TABLET | Freq: Once | ORAL | Status: AC
  Administered 2016-04-22: 10 mg via ORAL
  Filled 2016-04-22: qty 1

## 2016-04-22 MED ORDER — TRAMADOL HCL 50 MG PO TABS: 50.0000 mg | ORAL_TABLET | Freq: Four times a day (QID) | ORAL | 0 refills | Status: DC | PRN

## 2016-04-22 MED ORDER — TRAMADOL HCL 50 MG PO TABS
50.0000 mg | ORAL_TABLET | Freq: Once | ORAL | Status: AC
  Administered 2016-04-22: 50 mg via ORAL
  Filled 2016-04-22: qty 1

## 2016-04-22 MED ORDER — METHOCARBAMOL 750 MG PO TABS: 750.0000 mg | ORAL_TABLET | Freq: Four times a day (QID) | ORAL | 0 refills | Status: DC

## 2016-04-22 NOTE — ED Provider Notes (Signed)
[facility] Emergency Department Provider Note   ____________________________________________   First MD Initiated Contact with Patient 04/22/16 1555     (approximate)  I have reviewed the triage vital signs and the nursing notes.   HISTORY  Chief Complaint Back Pain    HPI Catherine Richardson is a 51 y.o. female patient complain of right-sided back pain for 2 days. Patient stated the pain radiates down her right leg. Patient states she has a history of chronic back pain but this is new complaining consistently of radicular component. Patient denies any bladder or bowel dysfunction. She stated no pain with sitting. Pain increases to a 6/10 with prolonged standing and ambulation. 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 No palliative measures for these complaints.  Past Medical History:  Diagnosis Date  . Hypertension     Patient Active Problem List   Diagnosis Date Noted  . Chest pain 12/06/2014  . Dyspnea   . HTN (hypertension), malignant   . Cocaine abuse   . ETOH abuse   . Orthostatic hypotension     History reviewed. No pertinent surgical history.  Prior to Admission medications   Medication Sig Start Date End Date Taking? Authorizing Provider  acetaminophen (TYLENOL) 500 MG tablet Take 1,000 mg by mouth every 6 (six) hours as needed.    Historical Provider, MD  cloNIDine (CATAPRES) 0.1 MG tablet Take 1 tablet (0.1 mg total) by mouth 2 (two) times daily. Patient not taking: Reported on 04/11/2016 08/30/15   Daymon Larsen, MD  diazepam (VALIUM) 2 MG tablet Take 1 tablet (2 mg total) by mouth every 8 (eight) hours as needed for muscle spasms. Patient not taking: Reported on 04/11/2016 03/25/16   Johnn Hai, PA-C  hydrochlorothiazide (MICROZIDE) 12.5 MG capsule Take 1 capsule (12.5 mg total) by mouth daily. 04/11/16   Drenda Freeze, MD  HYDROcodone-acetaminophen  (NORCO/VICODIN) 5-325 MG tablet Take 1 tablet by mouth every 4 (four) hours as needed for moderate pain. Patient not taking: Reported on 04/11/2016 03/25/16   Johnn Hai, PA-C  ibuprofen (ADVIL,MOTRIN) 600 MG tablet Take 1 tablet (600 mg total) by mouth every 8 (eight) hours as needed. Patient not taking: Reported on 04/11/2016 03/25/16   Johnn Hai, PA-C  lisinopril-hydrochlorothiazide (PRINZIDE,ZESTORETIC) 10-12.5 MG tablet Take 1 tablet by mouth daily. Patient not taking: Reported on 04/11/2016 12/07/14   Henreitta Leber, MD  methocarbamol (ROBAXIN-750) 750 MG tablet Take 1 tablet (750 mg total) by mouth 4 (four) times daily. 04/22/16   Sable Feil, PA-C  predniSONE (DELTASONE) 20 MG tablet 3 tabs po daily x 2 days then 2 tabs PO daily x 2 days then 1 tab PO daily x 2 days 04/11/16   Drenda Freeze, MD  traMADol (ULTRAM) 50 MG tablet Take 1 tablet (50 mg total) by mouth every 6 (six) hours as needed. 04/22/16 04/22/17  Sable Feil, PA-C    Allergies Patient has no known allergies.  Family History  Problem Relation Age of Onset  . CAD Mother   . CAD Father   . Deep vein thrombosis Brother     Social History Social History  Substance Use Topics  . Smoking status: Never Smoker  . Smokeless tobacco: Never Used  . Alcohol use 6.0 oz/week    3 Shots of liquor, 7 Cans of beer per week    Review of Systems Constitutional: No  fever/chills Eyes: No visual changes. ENT: No sore throat. Cardiovascular: Denies chest pain. Respiratory: Denies shortness of breath. Gastrointestinal: No abdominal pain.  No nausea, no vomiting.  No diarrhea.  No constipation. Genitourinary: Negative for dysuria. Musculoskeletal: Back pain Skin: Negative for rash. Neurological: Negative for headaches, focal weakness or numbness. Psychiatric: History of cocaine and alcohol abuse Endocrine:Hypertension ____________________________________________   PHYSICAL EXAM:  VITAL SIGNS: ED Triage  Vitals [04/22/16 1536]  Enc Vitals Group     BP (!) 168/68     Pulse Rate 100     Resp 18     Temp 98.3 F (36.8 C)     Temp Source Oral     SpO2 97 %     Weight 154 lb (69.9 kg)     Height 5\' 8"  (1.727 m)     Head Circumference      Peak Flow      Pain Score 7     Pain Loc      Pain Edu?      Excl. in [place]?     Constitutional: Alert and oriented. Well appearing and in no acute distress. Eyes: Conjunctivae are normal. PERRL. EOMI. Head: Atraumatic. Nose: No congestion/rhinnorhea. Mouth/Throat: Mucous membranes are moist.  Oropharynx non-erythematous. Neck: No stridor.  No cervical spine tenderness to palpation. Hematological/Lymphatic/Immunilogical: No cervical lymphadenopathy. Cardiovascular: Normal rate, regular rhythm. Grossly normal heart sounds.  Good peripheral circulation. Respiratory: Normal respiratory effort.  No retractions. Lungs CTAB. Gastrointestinal: Soft and nontender. No distention. No abdominal bruits. No CVA tenderness. Musculoskeletal: Patient says since they have every lasted upper extremities. Patient has slight right lumbar spine curvature. No guarding palpation spinal processes. Patient had bilateral straight leg test. Patient ambulated with atypical gait. Neurologic:  Normal speech and language. No gross focal neurologic deficits are appreciated. No gait instability. Skin:  Skin is warm, dry and intact. No rash noted. Psychiatric: Mood and affect are normal. Speech and behavior are normal.  ____________________________________________   LABS (all labs ordered are listed, but only abnormal results are displayed)  Labs Reviewed - No data to display ____________________________________________  EKG   ____________________________________________  RADIOLOGY  No acute findings. Patient does have spondylolisthesis and a spine curvature. ____________________________________________   PROCEDURES  Procedure(s) performed:  None  Procedures  Critical Care performed: No  ____________________________________________   INITIAL IMPRESSION / ASSESSMENT AND PLAN / ED COURSE  Pertinent labs & imaging results that were available during my care of the patient were reviewed by me and considered in my medical decision making (see chart for details).  Chronic back pain with radiculopathy. Patient given discharge care instructions. Patient given a prescription for tramadol and Robaxin. Patient given a work note. Patient advised to follow orthopedics for definitive evaluation and treatment.      ____________________________________________   FINAL CLINICAL IMPRESSION(S) / ED DIAGNOSES  Final diagnoses:  Chronic right-sided low back pain with sciatica, sciatica laterality unspecified      NEW MEDICATIONS STARTED DURING THIS VISIT:  New Prescriptions   METHOCARBAMOL (ROBAXIN-750) 750 MG TABLET    Take 1 tablet (750 mg total) by mouth 4 (four) times daily.   TRAMADOL (ULTRAM) 50 MG TABLET    Take 1 tablet (50 mg total) by mouth every 6 (six) hours as needed.     Note:  This document was prepared using Dragon voice recognition software and may include unintentional dictation errors.    Sable Feil, PA-C 04/22/16 1709    Earleen Newport, MD 04/23/16 [phone]

## 2016-04-22 NOTE — ED Notes (Signed)
Pt reports she has low back pain radiating into right leg.   Sx for 2 days. No known injury/  Similar sx 1 month ago and was seen in er.  Denies urinary sx.  Pt alert.

## 2016-04-22 NOTE — ED Triage Notes (Signed)
States right sided back pain for 2 days, right sided, denies any urianary symptoms

## 2017-02-06 ENCOUNTER — Other Ambulatory Visit: Payer: Self-pay

## 2017-02-06 ENCOUNTER — Emergency Department: Payer: Medicaid Other

## 2017-02-06 ENCOUNTER — Encounter: Payer: Self-pay | Admitting: Emergency Medicine

## 2017-02-06 ENCOUNTER — Emergency Department
Admission: EM | Admit: 2017-02-06 | Discharge: 2017-02-06 | Disposition: A | Discharge status: Home / Self Care | Payer: Medicaid Other | Attending: Emergency Medicine | Admitting: Emergency Medicine

## 2017-02-06 DIAGNOSIS — I10 Essential (primary) hypertension: Secondary | ICD-10-CM | POA: Diagnosis not present

## 2017-02-06 DIAGNOSIS — R079 Chest pain, unspecified: Secondary | ICD-10-CM

## 2017-02-06 LAB — CBC
HCT: 37.8 % (ref 35.0–47.0)
Hemoglobin: 12.7 g/dL (ref 12.0–16.0)
MCH: 32.1 pg (ref 26.0–34.0)
MCHC: 33.6 g/dL (ref 32.0–36.0)
MCV: 95.3 fL (ref 80.0–100.0)
PLATELETS: 212 10*3/uL (ref 150–440)
RBC: 3.96 MIL/uL (ref 3.80–5.20)
RDW: 13 % (ref 11.5–14.5)
WBC: 4.9 10*3/uL (ref 3.6–11.0)

## 2017-02-06 LAB — BASIC METABOLIC PANEL
Anion gap: 9 (ref 5–15)
BUN: 15 mg/dL (ref 6–20)
CALCIUM: 9.1 mg/dL (ref 8.9–10.3)
CHLORIDE: 105 mmol/L (ref 101–111)
CO2: 24 mmol/L (ref 22–32)
CREATININE: 0.55 mg/dL (ref 0.44–1.00)
GFR calc Af Amer: >60 mL/min (ref >60)
GFR calc non Af Amer: >60 mL/min (ref >60)
GLUCOSE: 104 mg/dL — AB (ref 65–99)
Potassium: 4 mmol/L (ref 3.5–5.1)
Sodium: 138 mmol/L (ref 135–145)

## 2017-02-06 LAB — TROPONIN I

## 2017-02-06 LAB — FIBRIN DERIVATIVES D-DIMER (ARMC ONLY): Fibrin derivatives D-dimer (ARMC): 349.13 ng/mL (FEU) (ref 0.00–499.00)

## 2017-02-06 MED ORDER — HYDROCHLOROTHIAZIDE 25 MG PO TABS: 25.0000 mg | ORAL_TABLET | Freq: Every day | ORAL | 1 refills | Status: DC

## 2017-02-06 MED ORDER — IBUPROFEN 600 MG PO TABS: 600.0000 mg | ORAL_TABLET | Freq: Three times a day (TID) | ORAL | 0 refills | Status: DC | PRN

## 2017-02-06 MED ORDER — DIAZEPAM 5 MG PO TABS
5.0000 mg | ORAL_TABLET | Freq: Once | ORAL | Status: AC
  Administered 2017-02-06: 5 mg via ORAL
  Filled 2017-02-06: qty 1
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
## 2017-02-06 NOTE — ED Notes (Signed)
Attempted IV access x 1, unable to advance catheter. Able to obtain blood samples.

## 2017-02-06 NOTE — ED Triage Notes (Signed)
C/O left lower rib pain with movement and cough x 2 days.  Denies fevers.  Cough is nonproductive..  Also c/o sinus congestion.

## 2017-02-06 NOTE — ED Notes (Signed)
Pt unable to urinate at this time. Pt given tv remote and warm blanket. Visitor at bedside.

## 2017-03-24 ENCOUNTER — Other Ambulatory Visit: Payer: Self-pay

## 2017-03-24 ENCOUNTER — Encounter: Payer: Self-pay | Admitting: Emergency Medicine

## 2017-03-24 ENCOUNTER — Emergency Department
Admission: EM | Admit: 2017-03-24 | Discharge: 2017-03-24 | Disposition: A | Discharge status: Home / Self Care | Payer: Medicaid Other | Attending: Emergency Medicine | Admitting: Emergency Medicine

## 2017-03-24 DIAGNOSIS — I10 Essential (primary) hypertension: Secondary | ICD-10-CM | POA: Diagnosis not present

## 2017-03-24 DIAGNOSIS — H1033 Unspecified acute conjunctivitis, bilateral: Secondary | ICD-10-CM

## 2017-03-24 DIAGNOSIS — H10023 Other mucopurulent conjunctivitis, bilateral: Secondary | ICD-10-CM | POA: Diagnosis not present

## 2017-03-24 MED ORDER — TOBRAMYCIN 0.3 % OP SOLN: 2.0000 [drp] | Freq: Four times a day (QID) | OPHTHALMIC | 0 refills | Status: DC

## 2017-03-24 NOTE — ED Provider Notes (Signed)
[facility] Emergency Department Provider Note   ____________________________________________   None    (approximate)  I have reviewed the triage vital signs and the nursing notes.   HISTORY  Chief Complaint Eye Pain  HPI Catherine Richardson is a 52 y.o. female is here complaint of sinus congestion and burning of her eyes for the last 2 weeks.  Patient states that for the last several days her lashes have been matting shut and there is yellow-green discharge from her eyes.  She is unaware of any exposure to pinkeye.  She denies any fever or chills.  She denies any cough.  Past Medical History:  Diagnosis Date  . Hypertension     Patient Active Problem List   Diagnosis Date Noted  . Chest pain 12/06/2014  . Dyspnea   . HTN (hypertension), malignant   . Cocaine abuse ([place])   . ETOH abuse   . Orthostatic hypotension     History reviewed. No pertinent surgical history.  Prior to Admission medications   Medication Sig Start Date End Date Taking? Authorizing Provider  tobramycin (TOBREX) 0.3 % ophthalmic solution Place 2 drops into both eyes every 6 (six) hours. 03/24/17   Johnn Hai, PA-C    Allergies Patient has no known allergies.  Family History  Problem Relation Age of Onset  . CAD Mother   . CAD Father   . Deep vein thrombosis Brother     Social History Social History   Tobacco Use  . Smoking status: Never Smoker  . Smokeless tobacco: Never Used  Substance Use Topics  . Alcohol use: Yes    Alcohol/week: 6.0 oz    Types: 3 Shots of liquor, 7 Cans of beer per week  . Drug use: No    Review of Systems Constitutional: No fever/chills Eyes: Positive for yellow green thick discharge. ENT: Positive for nasal congestion. Cardiovascular: Denies chest pain. Respiratory: Denies shortness of breath. Gastrointestinal: No abdominal pain.  No nausea, no vomiting.   Musculoskeletal: Negative for back pain. Skin: Negative for  rash. Neurological: Negative for headaches ___________________________________________   PHYSICAL EXAM:  VITAL SIGNS: ED Triage Vitals  Enc Vitals Group     BP 03/24/17 0825 (!) 183/96     Pulse Rate 03/24/17 0825 95     Resp 03/24/17 0825 16     Temp 03/24/17 0825 98.2 F (36.8 C)     Temp Source 03/24/17 0825 Oral     SpO2 03/24/17 0825 97 %     Weight 03/24/17 0823 163 lb (73.9 kg)     Height 03/24/17 0823 5\' 8"  (1.727 m)     Head Circumference --      Peak Flow --      Pain Score 03/24/17 0823 7     Pain Loc --      Pain Edu? --      Excl. in [place]? --    Constitutional: Alert and oriented. Well appearing and in no acute distress. Eyes: Conjunctivae are injected bilaterally with yellow exudate present.  PERRL. EOMI. Head: Atraumatic. Nose: No congestion/rhinnorhea. Mouth/Throat: Mucous membranes are moist.  Oropharynx non-erythematous. Neck: No stridor.   Cardiovascular: Normal rate, regular rhythm. Grossly normal heart sounds.  Good peripheral circulation. Respiratory: Normal respiratory effort.  No retractions. Lungs CTAB. Musculoskeletal: Normal gait was noted. Neurologic:  Normal speech and language. No gross focal neurologic deficits are appreciated. No gait instability. Skin:  Skin is warm, dry and intact. No rash noted. Psychiatric: Mood  and affect are normal. Speech and behavior are normal.  ____________________________________________   LABS (all labs ordered are listed, but only abnormal results are displayed)  Labs Reviewed - No data to display  PROCEDURES  Procedure(s) performed: None  Procedures  Critical Care performed: No  ____________________________________________   INITIAL IMPRESSION / ASSESSMENT AND PLAN / ED COURSE Patient was given prescription for Tobrex ophthalmic solution to use every 6 hours.  She states that she has an appointment with an eye doctor on Wednesday and is encouraged to keep this appointment.  We also discussed her  elevated blood pressure.  Patient does have a history of hypertension and has blood pressure medication with her which she does not take.  Patient is encouraged to follow-up with her pain for recheck of her blood pressure.  She will also use warm compresses to her eyes when her lashes are matting shut.  ____________________________________________   FINAL CLINICAL IMPRESSION(S) / ED DIAGNOSES  Final diagnoses:  Acute bacterial conjunctivitis of both eyes     ED Discharge Orders        Ordered    tobramycin (TOBREX) 0.3 % ophthalmic solution  Every 6 hours     03/24/17 0935       Note:  This document was prepared using Dragon voice recognition software and may include unintentional dictation errors.    Johnn Hai, PA-C 03/24/17 [place], Randall An, MD 03/24/17 1539

## 2017-03-24 NOTE — ED Notes (Signed)
See triage note  States she has had irritation to both eyes for about 2 weeks  Eyes were matted shut for the past 3-4 days ..both eyes irritated and slight swollen

## 2017-03-24 NOTE — ED Notes (Signed)
Pt verbalizes d/c understanding, follow up and RX. Pt given med managment infor for rx. Pt in NAD at time of d/c, VS stable

## 2017-03-24 NOTE — ED Triage Notes (Signed)
C/O eyes burning x 2 weeks.  Sinus congestion noted.

## 2017-03-24 NOTE — Discharge Instructions (Addendum)
Began using Tobrex ophthalmic solution 2 drops both eyes 4 times a day.  Keep your appointment with your eye doctor on Wednesday.  You may still need to use warm compresses to your eyes if you have a lot of matting. Begin taking your blood pressure medication as directed and follow-up with your primary care provider for recheck of your blood pressure.  Today your blood pressure was 183/96.

## 2017-07-14 IMAGING — CR DG LUMBAR SPINE COMPLETE 4+V
5 series · 5 of 5 positions shown · non-contrast
Comparison: 04/17/2015 stone study.

CLINICAL DATA: Right sided lower back pain radiating down right leg
to knee x2 days with no reported injury. Denies lumbar spine
surgery. Denies urinary symptoms.

EXAM:
LUMBAR SPINE - COMPLETE 4+ VIEW

[l-spine ap]
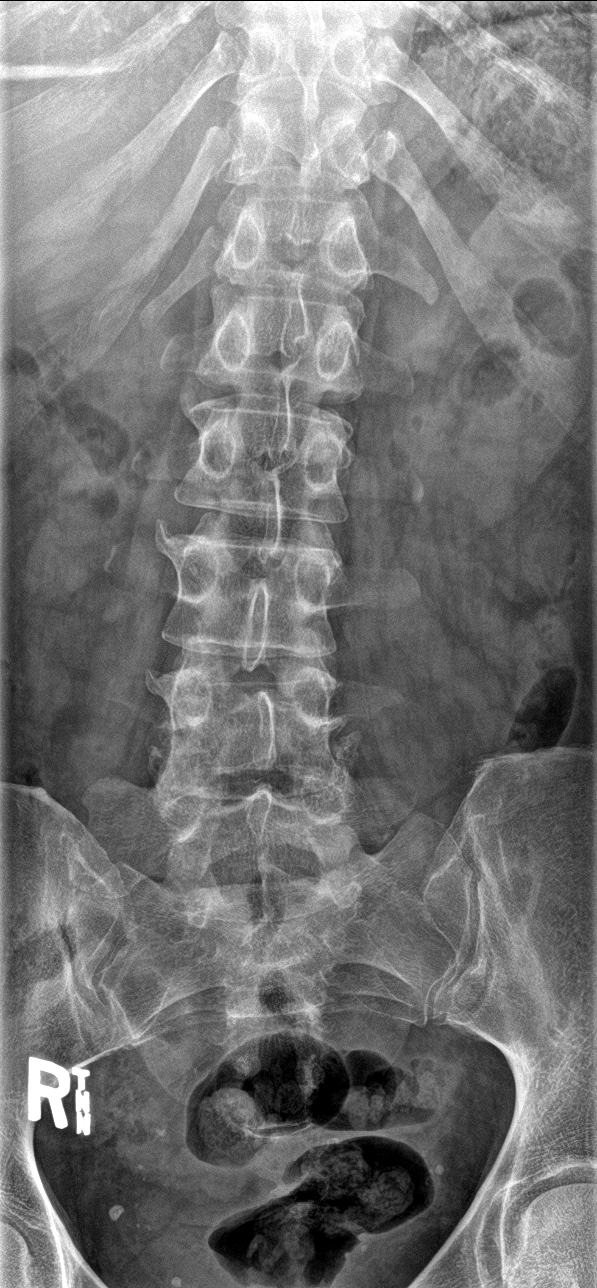

[l-spine obl (1 of 2)]
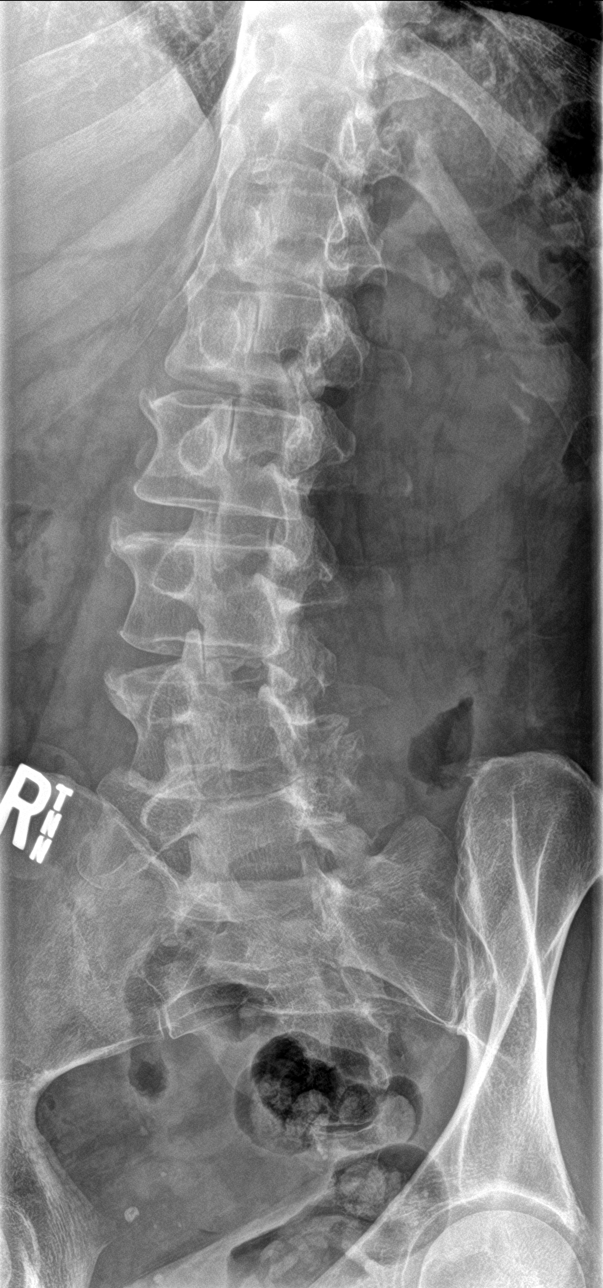

[l-spine obl (2 of 2)]
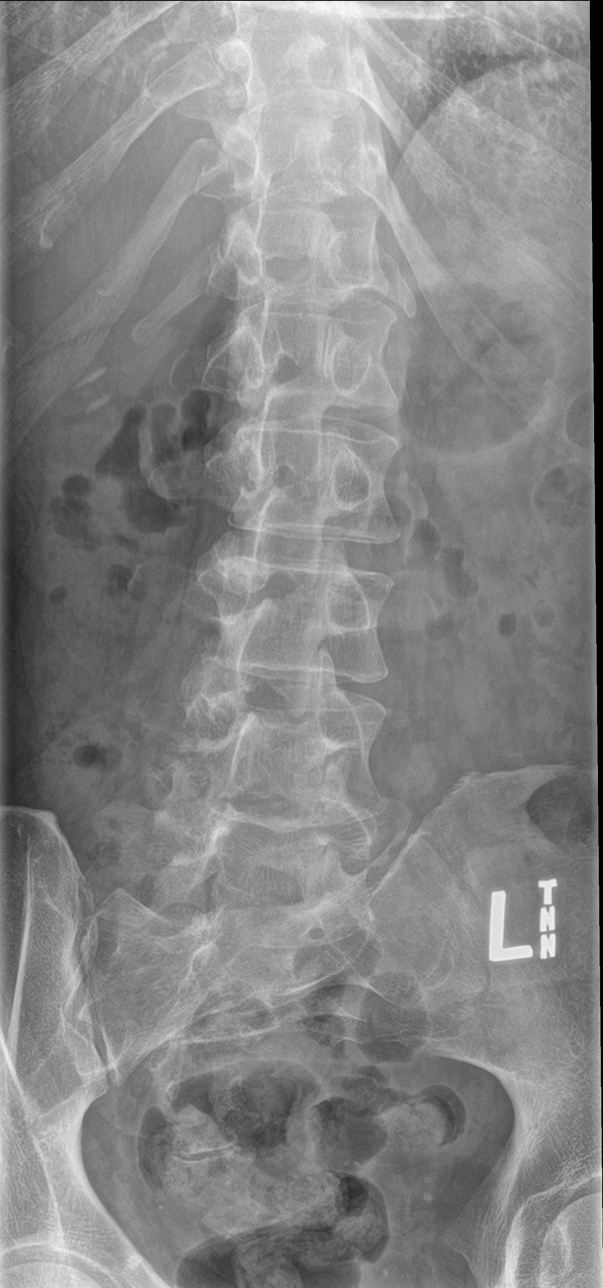

[l-spine lat]
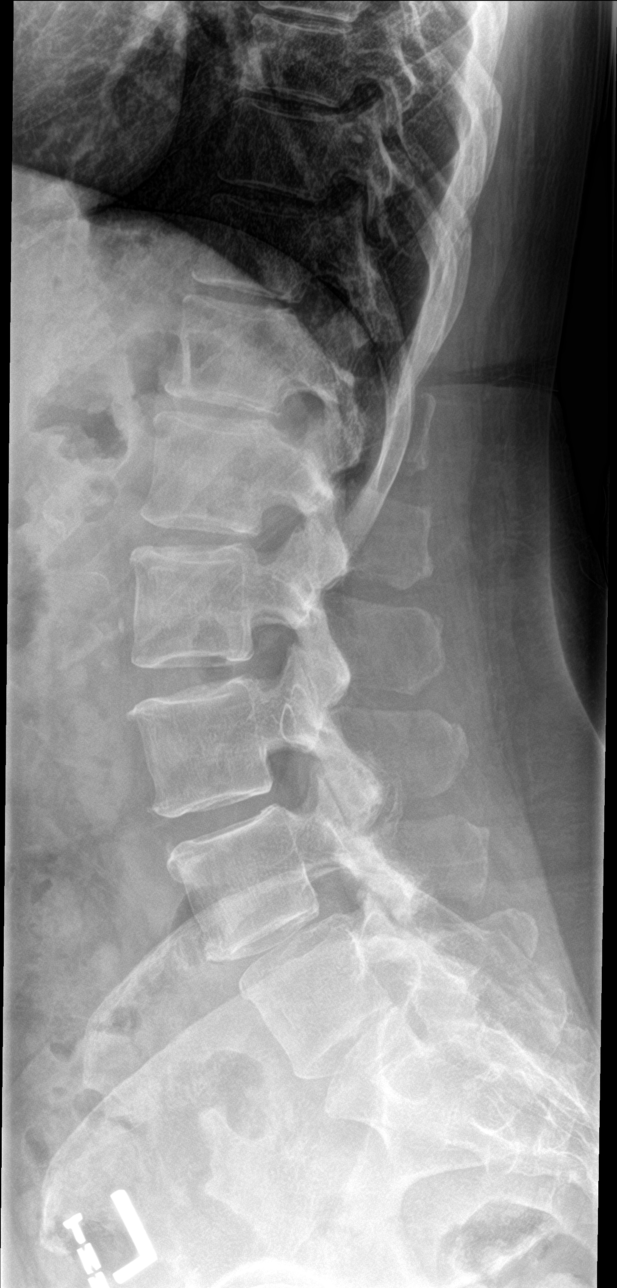

[l-spine spot]
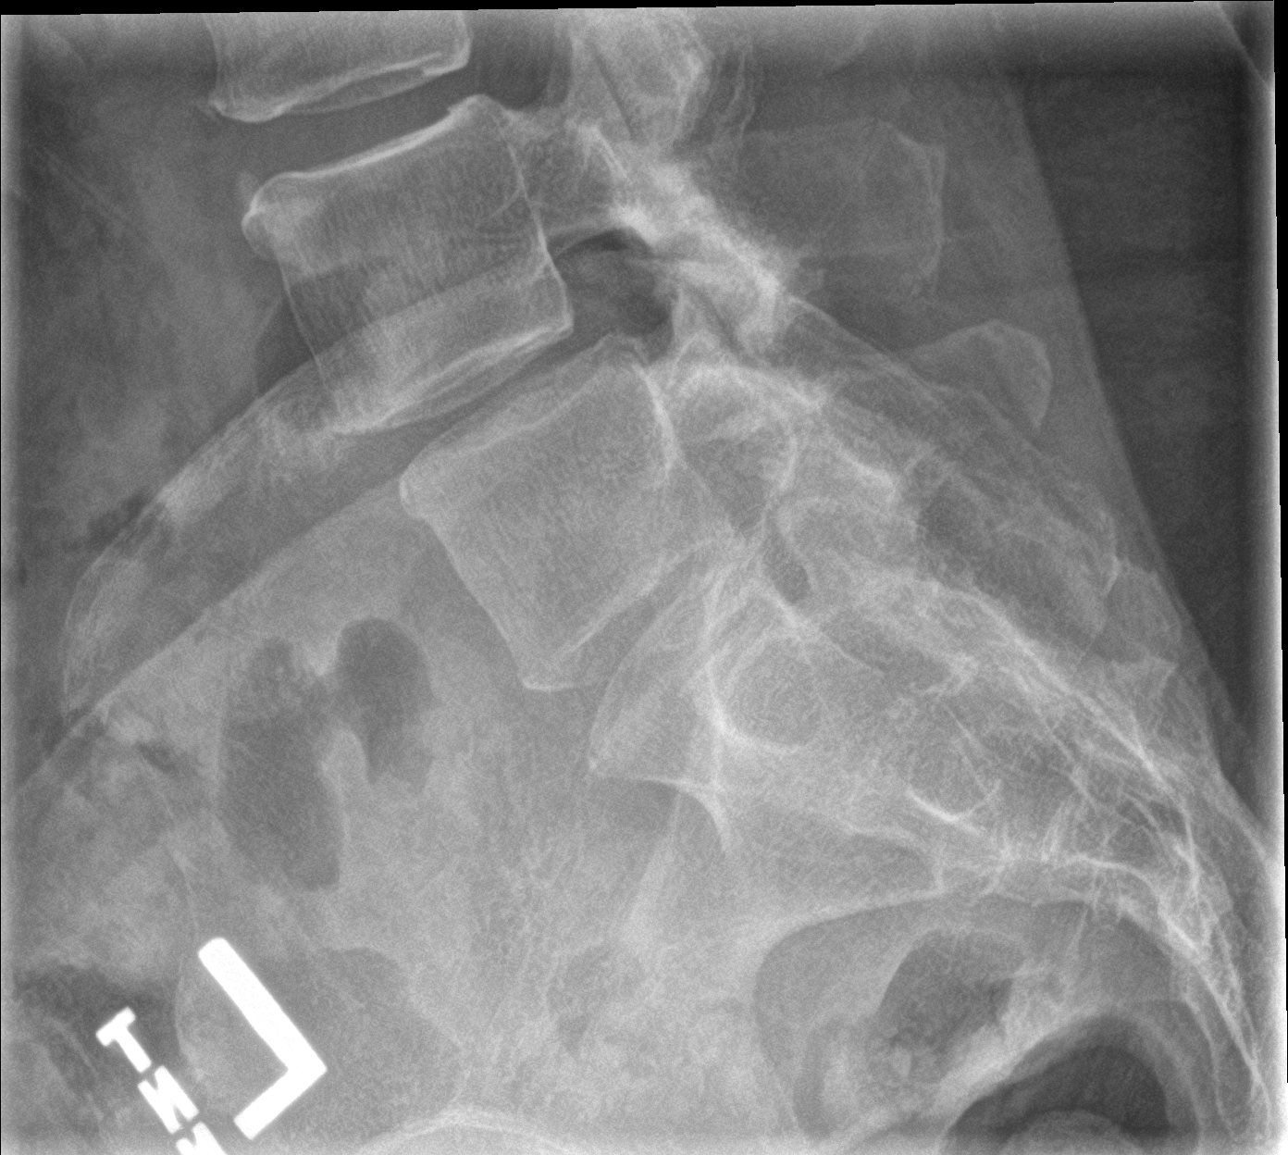

[5 of 5 positions shown; findings below may reference images not displayed]

FINDINGS: Diminutive twelfth ribs. Five lumbar type vertebral bodies. Minimal
convex right lumbar spine curvature. Sacroiliac joints are
symmetric. Trace L4-5 anterolisthesis is unchanged. Mild loss of
intervertebral disc height at the lumbosacral junction. Facet
arthropathy at L4-5.
IMPRESSION: Spondylosis and spinal curvature, without acute osseous finding.

## 2018-04-19 ENCOUNTER — Emergency Department
Admission: EM | Admit: 2018-04-19 | Discharge: 2018-04-19 | Disposition: A | Discharge status: Home / Self Care | Payer: Medicaid Other | Attending: Emergency Medicine | Admitting: Emergency Medicine

## 2018-04-19 ENCOUNTER — Other Ambulatory Visit: Payer: Self-pay

## 2018-04-19 ENCOUNTER — Encounter: Payer: Self-pay | Admitting: Emergency Medicine

## 2018-04-19 DIAGNOSIS — R05 Cough: Secondary | ICD-10-CM | POA: Diagnosis present

## 2018-04-19 DIAGNOSIS — I10 Essential (primary) hypertension: Secondary | ICD-10-CM | POA: Insufficient documentation

## 2018-04-19 DIAGNOSIS — Z79899 Other long term (current) drug therapy: Secondary | ICD-10-CM | POA: Diagnosis not present

## 2018-04-19 DIAGNOSIS — J101 Influenza due to other identified influenza virus with other respiratory manifestations: Secondary | ICD-10-CM | POA: Diagnosis not present

## 2018-04-19 LAB — URINALYSIS, COMPLETE (UACMP) WITH MICROSCOPIC
Bacteria, UA: NONE SEEN
Bilirubin Urine: NEGATIVE
Glucose, UA: NEGATIVE mg/dL
Ketones, ur: 5 mg/dL — AB
Leukocytes,Ua: NEGATIVE
Nitrite: NEGATIVE
Protein, ur: 100 mg/dL — AB
Specific Gravity, Urine: 1.036 — ABNORMAL HIGH (ref 1.005–1.030)
pH: 5 (ref 5.0–8.0)

## 2018-04-19 LAB — INFLUENZA PANEL BY PCR (TYPE A & B)
INFLAPCR: POSITIVE — AB
INFLBPCR: NEGATIVE

## 2018-04-19 MED ORDER — HYDROCOD POLST-CPM POLST ER 10-8 MG/5ML PO SUER: 5.0000 mL | Freq: Two times a day (BID) | ORAL | 0 refills | Status: DC

## 2018-04-19 MED ORDER — HYDROCOD POLST-CPM POLST ER 10-8 MG/5ML PO SUER
5.0000 mL | Freq: Once | ORAL | Status: AC
  Administered 2018-04-19: 5 mL via ORAL
  Filled 2018-04-19: qty 5

## 2018-04-19 MED ORDER — ACETAMINOPHEN 325 MG PO TABS
650.0000 mg | ORAL_TABLET | Freq: Once | ORAL | Status: AC
  Administered 2018-04-19: 650 mg via ORAL
  Filled 2018-04-19: qty 2

## 2018-04-19 NOTE — ED Triage Notes (Signed)
Pt reports feeling bad since Friday with cough, back pain, diarrhea and possibly fever; pt did not check temp at home; generalized body aches;

## 2018-04-19 NOTE — ED Notes (Addendum)
Patient to ED via EMS. EMS reports patient called for flu like illness to include body aches, nausea,vomiting and diarrhea. Patient is bentover in the wheelchair spitting into a back and making vomiting noises. Patient states she wants to lie down. Explained to patient the triage process and that we can put her in subwait once she gets triaged. Patient wants to lay in the floor. Told patient not to lay in the floor but patient is insistent. Family wheeled patient to the bathroom where she can be heard screaming at her family to "stop it". Patient continues to spit clear saliva into the emesis bag.

## 2018-04-19 NOTE — ED Notes (Signed)
Triage stopped for pt to go to the bathroom to "stink stink"; ED tech [place] with pt

## 2018-04-19 NOTE — Discharge Instructions (Addendum)
Follow-up with your primary care provider if any continued problems or [facility] acute care.  Increase fluids.  Tylenol or ibuprofen as needed for body aches and fever.  Begin taking Tussionex every 12 hours as needed for cough.  This medication contains a narcotic and you should not drive or operate machinery while taking this medication.  Also you are very contagious and should avoid being in groups of people.

## 2018-04-19 NOTE — ED Provider Notes (Signed)
[facility] Emergency Department Provider Note  ____________________________________________   None    (approximate)  I have reviewed the triage vital signs and the nursing notes.   HISTORY  Chief Complaint Cough; Fever; Back Pain; and Diarrhea  HPI Catherine Richardson is a 53 y.o. female   presents to the ED today via EMS with complaint of body aches and low-grade fever that started days ago.  Patient also complains of cough, back pain, diarrhea along with fever.  She complains of generalized body aches.  Patient has not taken any over-the-counter medication prior to arrival.  Her pain as a 9/10.   Past Medical History:  Diagnosis Date  . Hypertension     Patient Active Problem List   Diagnosis Date Noted  . Chest pain 12/06/2014  . Dyspnea   . HTN (hypertension), malignant   . Cocaine abuse ([place])   . ETOH abuse   . Orthostatic hypotension     History reviewed. No pertinent surgical history.  Prior to Admission medications   Medication Sig Start Date End Date Taking? Authorizing Provider  amLODipine (NORVASC) 10 MG tablet Take 10 mg by mouth daily.   Yes [provider]  atorvastatin (LIPITOR) 40 MG tablet Take 40 mg by mouth daily.   Yes [provider]  chlorpheniramine-HYDROcodone (TUSSIONEX PENNKINETIC ER) 10-8 MG/5ML SUER Take 5 mLs by mouth 2 (two) times daily. 04/19/18   Johnn Hai, PA-C    Allergies Patient has no known allergies.  Family History  Problem Relation Age of Onset  . CAD Mother   . CAD Father   . Deep vein thrombosis Brother     Social History Social History   Tobacco Use  . Smoking status: Never Smoker  . Smokeless tobacco: Never Used  Substance Use Topics  . Alcohol use: Yes    Alcohol/week: 10.0 standard drinks    Types: 3 Shots of liquor, 7 Cans of beer per week  . Drug use: No    Review of Systems Constitutional: Subjective fever/chills Eyes: No visual changes. ENT: No sore  throat. Cardiovascular: Denies chest pain. Respiratory: Denies shortness of breath.  Positive cough. Gastrointestinal: No abdominal pain.  No nausea, positive vomiting.  Positive diarrhea.   Genitourinary: Negative for dysuria. Musculoskeletal: Positive body aches. Skin: Negative for rash. Neurological: Negative for headaches, focal weakness or numbness. ____________________________________________   PHYSICAL EXAM:  VITAL SIGNS: ED Triage Vitals  Enc Vitals Group     BP 04/19/18 0403 104/61     Pulse Rate 04/19/18 0403 94     Resp 04/19/18 0403 17     Temp 04/19/18 0403 (!) 100.9 F (38.3 C)     Temp Source 04/19/18 0403 Oral     SpO2 04/19/18 0403 100 %     Weight 04/19/18 0404 165 lb (74.8 kg)     Height 04/19/18 0404 5\' 8"  (1.727 m)     Head Circumference --      Peak Flow --      Pain Score 04/19/18 0403 9     Pain Loc --      Pain Edu? --      Excl. in [place]? --    Constitutional: Alert and oriented. Well appearing and in no acute distress. Eyes: Conjunctivae are normal.  Head: Atraumatic. Nose: Mild congestion/rhinnorhea.  EACs and TMs are clear bilaterally. Mouth/Throat: Mucous membranes are moist.  Oropharynx non-erythematous. Neck: No stridor.   Hematological/Lymphatic/Immunilogical: No cervical lymphadenopathy. Cardiovascular: Normal rate, regular rhythm.  Grossly normal heart sounds.  Good peripheral circulation. Respiratory: Normal respiratory effort.  No retractions. Lungs CTAB. Gastrointestinal: Soft and nontender. No distention. Musculoskeletal: Moves upper and lower extremities without any difficulty.  Neurologic:  Normal speech and language. No gross focal neurologic deficits are appreciated.  Skin:  Skin is warm, dry and intact. No rash noted. Psychiatric: Mood and affect are normal. Speech and behavior are normal.  ____________________________________________   LABS (all labs ordered are listed, but only abnormal results are displayed)  Labs Reviewed   INFLUENZA PANEL BY PCR (TYPE A & B) - Abnormal; Notable for the following components:      Result Value   Influenza A By PCR POSITIVE (*)    All other components within normal limits  URINALYSIS, COMPLETE (UACMP) WITH MICROSCOPIC - Abnormal; Notable for the following components:   Color, Urine AMBER (*)    APPearance HAZY (*)    Specific Gravity, Urine 1.036 (*)    Hgb urine dipstick MODERATE (*)    Ketones, ur 5 (*)    Protein, ur 100 (*)    All other components within normal limits    PROCEDURES  Procedure(s) performed (including Critical Care):  Procedures   ____________________________________________   INITIAL IMPRESSION / ASSESSMENT AND PLAN / ED COURSE  As part of my medical decision making, I reviewed the following data within the electronic MEDICAL RECORD NUMBER Notes from prior ED visits and [place] Controlled Substance Database  Patient presents to the ED with complaint of influenza-like symptoms.  Patient was given Tylenol and Tussionex while in the ED.  Influenza test was positive for influenza A.  Patient was made aware and encouraged to drink fluids, Tylenol or ibuprofen and to follow-up with her PCP.  Because of the 4 days since onset of symptoms she was not a candidate for Tamiflu.  She was given a prescription for Tussionex twice a day as needed for cough.  Patient return to the emergency department for any severe worsening of her symptoms.  ____________________________________________   FINAL CLINICAL IMPRESSION(S) / ED DIAGNOSES  Final diagnoses:  Influenza A     ED Discharge Orders         Ordered    chlorpheniramine-HYDROcodone (TUSSIONEX PENNKINETIC ER) 10-8 MG/5ML SUER  2 times daily     04/19/18 0834           Note:  This document was prepared using Dragon voice recognition software and may include unintentional dictation errors.    Johnn Hai, PA-C 04/19/18 1602    Earleen Newport, MD 04/20/18 [phone]

## 2018-04-19 NOTE — ED Notes (Signed)
See triage note  Presents with body aches and low grade fever  States sxs' started on friday

## 2018-08-24 ENCOUNTER — Other Ambulatory Visit: Payer: Self-pay

## 2018-08-24 ENCOUNTER — Encounter: Payer: Self-pay | Admitting: Emergency Medicine

## 2018-08-24 ENCOUNTER — Emergency Department
Admission: EM | Admit: 2018-08-24 | Discharge: 2018-08-24 | Disposition: A | Discharge status: Home / Self Care | Payer: Medicaid Other | Attending: Emergency Medicine | Admitting: Emergency Medicine

## 2018-08-24 DIAGNOSIS — Y999 Unspecified external cause status: Secondary | ICD-10-CM | POA: Insufficient documentation

## 2018-08-24 DIAGNOSIS — X101XXA Contact with hot food, initial encounter: Secondary | ICD-10-CM | POA: Insufficient documentation

## 2018-08-24 DIAGNOSIS — Y929 Unspecified place or not applicable: Secondary | ICD-10-CM | POA: Diagnosis not present

## 2018-08-24 DIAGNOSIS — I10 Essential (primary) hypertension: Secondary | ICD-10-CM | POA: Diagnosis not present

## 2018-08-24 DIAGNOSIS — Y93G1 Activity, food preparation and clean up: Secondary | ICD-10-CM | POA: Diagnosis not present

## 2018-08-24 DIAGNOSIS — Z79899 Other long term (current) drug therapy: Secondary | ICD-10-CM | POA: Insufficient documentation

## 2018-08-24 DIAGNOSIS — T24211A Burn of second degree of right thigh, initial encounter: Secondary | ICD-10-CM | POA: Insufficient documentation

## 2018-08-24 DIAGNOSIS — S8991XA Unspecified injury of right lower leg, initial encounter: Secondary | ICD-10-CM | POA: Diagnosis present

## 2018-08-24 DIAGNOSIS — T24291A Burn of second degree of multiple sites of right lower limb, except ankle and foot, initial encounter: Secondary | ICD-10-CM

## 2018-08-24 MED ORDER — SILVER SULFADIAZINE 1 % EX CREA: TOPICAL_CREAM | CUTANEOUS | 1 refills | Status: DC

## 2018-08-24 MED ORDER — OXYCODONE-ACETAMINOPHEN 5-325 MG PO TABS
1.0000 | ORAL_TABLET | Freq: Once | ORAL | Status: AC
  Administered 2018-08-24: 1 via ORAL
  Filled 2018-08-24: qty 1

## 2018-08-24 MED ORDER — OXYCODONE-ACETAMINOPHEN 5-325 MG PO TABS: 1.0000 | ORAL_TABLET | Freq: Three times a day (TID) | ORAL | 0 refills | Status: AC | PRN

## 2018-08-24 MED ORDER — SILVER SULFADIAZINE 1 % EX CREA
TOPICAL_CREAM | Freq: Once | CUTANEOUS | Status: AC
  Administered 2018-08-24: 13:00:00 via TOPICAL
  Filled 2018-08-24: qty 85
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
## 2018-08-24 NOTE — ED Provider Notes (Signed)
[facility] Emergency Department Provider Note  ____________________________________________  Time seen: Approximately 11:02 AM  I have reviewed the triage vital signs and the nursing notes.   HISTORY  Chief Complaint Burn    HPI Catherine Richardson is a 53 y.o. female that presents to the emergency department for evaluation of burn to right thigh for 3 days.  Patient spelt chicken broth on her thigh late Saturday night.  Tetanus shot is up-to-date.   Past Medical History:  Diagnosis Date  . Hypertension     Patient Active Problem List   Diagnosis Date Noted  . Chest pain 12/06/2014  . Dyspnea   . HTN (hypertension), malignant   . Cocaine abuse ([place])   . ETOH abuse   . Orthostatic hypotension     No past surgical history on file.  Prior to Admission medications   Medication Sig Start Date End Date Taking? Authorizing Provider  amLODipine (NORVASC) 10 MG tablet Take 10 mg by mouth daily.    [provider]  atorvastatin (LIPITOR) 40 MG tablet Take 40 mg by mouth daily.    [provider]  chlorpheniramine-HYDROcodone (TUSSIONEX PENNKINETIC ER) 10-8 MG/5ML SUER Take 5 mLs by mouth 2 (two) times daily. 04/19/18   Johnn Hai, PA-C  oxyCODONE-acetaminophen (PERCOCET) 5-325 MG tablet Take 1 tablet by mouth every 8 (eight) hours as needed for up to 3 days. 08/24/18 08/27/18  Laban Emperor, PA-C  silver sulfADIAZINE (SILVADENE) 1 % cream Apply to affected area daily 08/24/18 08/24/19  Laban Emperor, PA-C    Allergies Patient has no known allergies.  Family History  Problem Relation Age of Onset  . CAD Mother   . CAD Father   . Deep vein thrombosis Brother     Social History Social History   Tobacco Use  . Smoking status: Never Smoker  . Smokeless tobacco: Never Used  Substance Use Topics  . Alcohol use: Yes    Alcohol/week: 10.0 standard drinks    Types: 3 Shots of liquor, 7 Cans of beer per week  . Drug use: No      Review of Systems  Respiratory: No SOB. Gastrointestinal: No abdominal pain.  No nausea, no vomiting.  Skin: Negative for abrasions, lacerations, ecchymosis.  Positive for burn. Neurological: Negative for numbness or tingling   ____________________________________________   PHYSICAL EXAM:  VITAL SIGNS: ED Triage Vitals  Enc Vitals Group     BP 08/24/18 1006 (!) 143/72     Pulse Rate 08/24/18 1006 99     Resp 08/24/18 1006 16     Temp 08/24/18 1006 98.5 F (36.9 C)     Temp Source 08/24/18 1006 Oral     SpO2 08/24/18 1006 92 %     Weight --      Height --      Head Circumference --      Peak Flow --      Pain Score 08/24/18 1000 8     Pain Loc --      Pain Edu? --      Excl. in [place]? --      Constitutional: Alert and oriented. Well appearing and in no acute distress. Eyes: Conjunctivae are normal. PERRL. EOMI. Head: Atraumatic. ENT:      Ears:      Nose: No congestion/rhinnorhea.      Mouth/Throat: Mucous membranes are moist.  Neck: No stridor.  Cardiovascular: Normal rate, regular rhythm.  Good peripheral circulation. Respiratory: Normal respiratory effort without tachypnea  or retractions. Lungs CTAB. Good air entry to the bases with no decreased or absent breath sounds. Musculoskeletal: Full range of motion to all extremities. No gross deformities appreciated. Neurologic:  Normal speech and language. No gross focal neurologic deficits are appreciated.  Skin:  Skin is warm, dry.  11 inch x 1 inch second-degree partial-thickness burn to right thigh that extends into right knee.  Tender to palpation.  2 inch x 1 cm blister to mid thigh.  1 inch x 1 cm blister to mid calf.  No necrotic skin.  No drainage.  Burn covers approximately 2% of patient's body. Psychiatric: Mood and affect are normal. Speech and behavior are normal. Patient exhibits appropriate insight and judgement.   ____________________________________________   LABS (all labs ordered are listed, but  only abnormal results are displayed)  Labs Reviewed - No data to display ____________________________________________  EKG   ____________________________________________  RADIOLOGY  No results found.  ____________________________________________    PROCEDURES  Procedure(s) performed:    Procedures    Medications  silver sulfADIAZINE (SILVADENE) 1 % cream ( Topical Given 08/24/18 1232)  oxyCODONE-acetaminophen (PERCOCET/ROXICET) 5-325 MG per tablet 1 tablet (1 tablet Oral Given 08/24/18 1122)     ____________________________________________   INITIAL IMPRESSION / ASSESSMENT AND PLAN / ED COURSE  Pertinent labs & imaging results that were available during my care of the patient were reviewed by me and considered in my medical decision making (see chart for details).  Review of the [place] CSRS was performed in accordance of the [place] prior to dispensing any controlled drugs.   Patient's diagnosis is consistent with second-degree partial burn that happened 3 days ago.  Vital signs and exam are reassuring.  Silvadene cream was applied and burn was wrapped.  Patient will be discharged home with prescriptions for Silvadene cream and a short course of oxycodone.  Patient was given information and phone number for the burn clinic.  She will call [facility] burn clinic today.  Patient is to follow up with burn clinic as directed. Patient is given ED precautions to return to the ED for any worsening or new symptoms.   Catherine Richardson was evaluated in Emergency Department on 08/24/2018 for the symptoms described in the history of present illness. She was evaluated in the context of the global COVID-19 pandemic, which necessitated consideration that the patient might be at risk for infection with the SARS-CoV-2 virus that causes COVID-19. Institutional protocols and algorithms that pertain to the evaluation of patients at risk for COVID-19 are in a state of rapid change based on information  released by regulatory bodies including the CDC and federal and state organizations. These policies and algorithms were followed during the patient's care in the ED.  ____________________________________________  FINAL CLINICAL IMPRESSION(S) / ED DIAGNOSES  Final diagnoses:  Second degree burn of multiple sites of right lower extremity except ankle and foot, initial encounter      NEW MEDICATIONS STARTED DURING THIS VISIT:  ED Discharge Orders         Ordered    silver sulfADIAZINE (SILVADENE) 1 % cream     08/24/18 1129    oxyCODONE-acetaminophen (PERCOCET) 5-325 MG tablet  Every 8 hours PRN     08/24/18 1248              This chart was dictated using voice recognition software/Dragon. Despite best efforts to proofread, errors can occur which can change the meaning. Any change was purely unintentional.    Laban Emperor, PA-C 08/24/18  [id number]    Delman Kitten, MD 08/24/18 1625

## 2018-10-19 ENCOUNTER — Other Ambulatory Visit: Payer: Self-pay

## 2018-10-19 ENCOUNTER — Emergency Department
Admission: EM | Admit: 2018-10-19 | Discharge: 2018-10-19 | Disposition: A | Discharge status: Home / Self Care | Payer: Medicaid Other | Attending: Emergency Medicine | Admitting: Emergency Medicine

## 2018-10-19 DIAGNOSIS — Z79899 Other long term (current) drug therapy: Secondary | ICD-10-CM | POA: Diagnosis not present

## 2018-10-19 DIAGNOSIS — M5432 Sciatica, left side: Secondary | ICD-10-CM | POA: Insufficient documentation

## 2018-10-19 DIAGNOSIS — M79605 Pain in left leg: Secondary | ICD-10-CM | POA: Diagnosis present

## 2018-10-19 DIAGNOSIS — I10 Essential (primary) hypertension: Secondary | ICD-10-CM | POA: Insufficient documentation

## 2018-10-19 MED ORDER — HYDROCODONE-ACETAMINOPHEN 5-325 MG PO TABS: 1.0000 | ORAL_TABLET | ORAL | 0 refills | Status: DC | PRN

## 2018-10-19 MED ORDER — PREDNISONE 10 MG PO TABS: ORAL_TABLET | ORAL | 0 refills | Status: DC

## 2018-10-19 NOTE — Discharge Instructions (Signed)
Follow-up with your primary care provider or Dr. Truitt Leep who is the orthopedist on call if any continued problems.  Begin taking prednisone as directed and Norco only if needed for pain.  Do not drive or operate machinery while taking this pain medication.

## 2018-10-19 NOTE — ED Provider Notes (Signed)
[facility] Emergency Department Provider Note  ____________________________________________   First MD Initiated Contact with Patient 10/19/18 [phone]     (approximate)  I have reviewed the triage vital signs and the nursing notes.   HISTORY  Chief Complaint Leg Pain   HPI Catherine Richardson is a 53 y.o. female presents to the ED with complaint of left hip and radiating into her left leg to her toes.  Patient states that she has a history of sciatica with the last time being approximately 2 years ago.  She denies any recent injury and states that this feels similar.  She denies any saddle anesthesias or incontinence of bowel or bladder.  Patient continues to ambulate.  She states that over-the-counter medication has not helped.  Symptoms have been going on for approximately 1 week but states that yesterday the pain increased.  She rates her pain as 6 out of 10.         Past Medical History:  Diagnosis Date  . Hypertension     Patient Active Problem List   Diagnosis Date Noted  . Chest pain 12/06/2014  . Dyspnea   . HTN (hypertension), malignant   . Cocaine abuse ([place])   . ETOH abuse   . Orthostatic hypotension     History reviewed. No pertinent surgical history.  Prior to Admission medications   Medication Sig Start Date End Date Taking? Authorizing Provider  amLODipine (NORVASC) 10 MG tablet Take 10 mg by mouth daily.    [provider]  atorvastatin (LIPITOR) 40 MG tablet Take 40 mg by mouth daily.    [provider]  HYDROcodone-acetaminophen (NORCO/VICODIN) 5-325 MG tablet Take 1 tablet by mouth every 4 (four) hours as needed for moderate pain. 10/19/18   Johnn Hai, PA-C  predniSONE (DELTASONE) 10 MG tablet Take 6 tablets  today, on day 2 take 5 tablets, day 3 take 4 tablets, day 4 take 3 tablets, day 5 take  2 tablets and 1 tablet the last day 10/19/18   Johnn Hai, PA-C    Allergies Patient has no known  allergies.  Family History  Problem Relation Age of Onset  . CAD Mother   . CAD Father   . Deep vein thrombosis Brother     Social History Social History   Tobacco Use  . Smoking status: Never Smoker  . Smokeless tobacco: Never Used  Substance Use Topics  . Alcohol use: Yes    Alcohol/week: 10.0 standard drinks    Types: 3 Shots of liquor, 7 Cans of beer per week  . Drug use: No    Review of Systems Constitutional: No fever/chills Cardiovascular: Denies chest pain. Respiratory: Denies shortness of breath. Genitourinary: Negative for dysuria. Musculoskeletal: Left hip with radiation into the left lower extremity. Skin: Negative for rash. Neurological: Negative for headaches, focal weakness or numbness. ____________________________________________   PHYSICAL EXAM:  VITAL SIGNS: ED Triage Vitals  Enc Vitals Group     BP 10/19/18 0711 (!) 165/84     Pulse Rate 10/19/18 0711 88     Resp 10/19/18 0711 16     Temp 10/19/18 0711 98.3 F (36.8 C)     Temp Source 10/19/18 0711 Oral     SpO2 10/19/18 0711 98 %     Weight 10/19/18 0712 164 lb (74.4 kg)     Height 10/19/18 0712 5\' 8"  (1.727 m)     Head Circumference --      Peak Flow --  Pain Score 10/19/18 0712 6     Pain Loc --      Pain Edu? --      Excl. in [place]? --    Constitutional: Alert and oriented. Well appearing and in no acute distress. Eyes: Conjunctivae are normal.  Head: Atraumatic. Neck: No stridor.   Cardiovascular: Normal rate, regular rhythm. Grossly normal heart sounds.  Good peripheral circulation. Respiratory: Normal respiratory effort.  No retractions. Lungs CTAB. Musculoskeletal: On examination of the lower back there is no gross deformity.  There is tenderness on palpation of the left SI joint area which reproduces patient's pain.  Straight leg raises were negative.  Good muscle strength bilaterally. Neurologic:  Normal speech and language. No gross focal neurologic deficits are appreciated.   Reflexes were 2+ bilaterally.  No gait instability. Skin:  Skin is warm, dry and intact. No rash noted. Psychiatric: Mood and affect are normal. Speech and behavior are normal.  ____________________________________________   LABS (all labs ordered are listed, but only abnormal results are displayed)  Labs Reviewed - No data to display   PROCEDURES  Procedure(s) performed (including Critical Care):  Procedures   ____________________________________________   INITIAL IMPRESSION / ASSESSMENT AND PLAN / ED COURSE  As part of my medical decision making, I reviewed the following data within the electronic MEDICAL RECORD NUMBER Notes from prior ED visits and [place] Controlled Substance Database   53 year old female presents to the ED with complaint of left hip pain radiating the entire length of her left lower extremity.  Patient has a history of sciatica and this feels similar.  She denies any recent injury.  Patient's chart confirms that she has not had any difficulty since 2018.  Patient is agreeable with the prednisone six-day taper and was given a prescription for Norco if needed for severe pain.  Patient is to follow-up with orthopedics if any continued problems or not improving.  ____________________________________________   FINAL CLINICAL IMPRESSION(S) / ED DIAGNOSES  Final diagnoses:  Sciatica of left side     ED Discharge Orders         Ordered    predniSONE (DELTASONE) 10 MG tablet     10/19/18 0816    HYDROcodone-acetaminophen (NORCO/VICODIN) 5-325 MG tablet  Every 4 hours PRN     10/19/18 0816           Note:  This document was prepared using Dragon voice recognition software and may include unintentional dictation errors.    Johnn Hai, PA-C 10/19/18 1101    Harvest Dark, MD 10/19/18 1427

## 2018-10-19 NOTE — ED Triage Notes (Signed)
Pt c/o left upper leg and hip pain since yesterday, denies injury.

## 2018-10-19 NOTE — ED Notes (Signed)
See triage note  Presents with left hip /leg pain  States pain starts in lower back and moves into left leg  Ambulates well  Denies any injury  Hx of same about 2 years ago

## 2019-01-07 ENCOUNTER — Emergency Department
Admission: EM | Admit: 2019-01-07 | Discharge: 2019-01-07 | Disposition: A | Discharge status: Home / Self Care | Payer: Medicaid Other | Attending: Emergency Medicine | Admitting: Emergency Medicine

## 2019-01-07 ENCOUNTER — Other Ambulatory Visit: Payer: Self-pay

## 2019-01-07 ENCOUNTER — Encounter: Payer: Self-pay | Admitting: Emergency Medicine

## 2019-01-07 DIAGNOSIS — Z79899 Other long term (current) drug therapy: Secondary | ICD-10-CM | POA: Diagnosis not present

## 2019-01-07 DIAGNOSIS — I10 Essential (primary) hypertension: Secondary | ICD-10-CM | POA: Insufficient documentation

## 2019-01-07 NOTE — ED Provider Notes (Signed)
[facility] Emergency Department Provider Note   ____________________________________________   First MD Initiated Contact with Patient 01/07/19 [phone]     (approximate)  I have reviewed the triage vital signs and the nursing notes.   HISTORY  Chief Complaint Hypertension    HPI Catherine Richardson is a 53 y.o. female patient was concerned because her home wrist blood pressure monitor read over 200.  Patient denies any symptoms but she was concerned about the readings.  Patient has a history of hypertension and takes medicines on a daily basis.  Takes medication in the morning.         Past Medical History:  Diagnosis Date  . Hypertension     Patient Active Problem List   Diagnosis Date Noted  . Chest pain 12/06/2014  . Dyspnea   . HTN (hypertension), malignant   . Cocaine abuse ([place])   . ETOH abuse   . Orthostatic hypotension     History reviewed. No pertinent surgical history.  Prior to Admission medications   Medication Sig Start Date End Date Taking? Authorizing Provider  amLODipine (NORVASC) 10 MG tablet Take 10 mg by mouth daily.    [provider]  atorvastatin (LIPITOR) 40 MG tablet Take 40 mg by mouth daily.    [provider]  HYDROcodone-acetaminophen (NORCO/VICODIN) 5-325 MG tablet Take 1 tablet by mouth every 4 (four) hours as needed for moderate pain. 10/19/18   Johnn Hai, PA-C  predniSONE (DELTASONE) 10 MG tablet Take 6 tablets  today, on day 2 take 5 tablets, day 3 take 4 tablets, day 4 take 3 tablets, day 5 take  2 tablets and 1 tablet the last day 10/19/18   Johnn Hai, PA-C    Allergies Patient has no known allergies.  Family History  Problem Relation Age of Onset  . CAD Mother   . CAD Father   . Deep vein thrombosis Brother     Social History Social History   Tobacco Use  . Smoking status: Never Smoker  . Smokeless tobacco: Never Used  Substance Use Topics  . Alcohol use: Yes   Alcohol/week: 10.0 standard drinks    Types: 3 Shots of liquor, 7 Cans of beer per week  . Drug use: No    Review of Systems Constitutional: No fever/chills Eyes: No visual changes. ENT: No sore throat. Cardiovascular: Denies chest pain. Respiratory: Denies shortness of breath. Gastrointestinal: No abdominal pain.  No nausea, no vomiting.  No diarrhea.  No constipation. Genitourinary: Negative for dysuria. Musculoskeletal: Negative for back pain. Skin: Negative for rash. Neurological: Negative for headaches, focal weakness or numbness. Psychiatric:  Cocaine and EtOH abuse. Endocrine:  Hypertension   ____________________________________________   PHYSICAL EXAM:  VITAL SIGNS: ED Triage Vitals  Enc Vitals Group     BP 01/07/19 0926 (!) 154/79     Pulse Rate 01/07/19 0926 86     Resp 01/07/19 0926 15     Temp 01/07/19 0926 98.7 F (37.1 C)     Temp Source 01/07/19 0926 Oral     SpO2 01/07/19 0926 98 %     Weight 01/07/19 0926 170 lb (77.1 kg)     Height 01/07/19 0926 5\' 8"  (1.727 m)     Head Circumference --      Peak Flow --      Pain Score 01/07/19 0932 0     Pain Loc --      Pain Edu? --  Excl. in [place]? --    Constitutional: Alert and oriented. Well appearing and in no acute distress. Hematological/Lymphatic/Immunilogical: No cervical lymphadenopathy. Cardiovascular: Normal rate, regular rhythm. Grossly normal heart sounds.  Good peripheral circulation. Respiratory: Normal respiratory effort.  No retractions. Lungs CTAB. Skin:  Skin is warm, dry and intact. No rash noted. Psychiatric: Mood and affect are normal. Speech and behavior are normal.  ____________________________________________   LABS (all labs ordered are listed, but only abnormal results are displayed)  Labs Reviewed - No data to display ____________________________________________  EKG   ____________________________________________  RADIOLOGY  ED MD interpretation:    Official  radiology report(s): No results found.  ____________________________________________   PROCEDURES  Procedure(s) performed (including Critical Care):  Procedures   ____________________________________________   INITIAL IMPRESSION / ASSESSMENT AND PLAN / ED COURSE  As part of my medical decision making, I reviewed the following data within the [place] was evaluated in Emergency Department on 01/07/2019 for the symptoms described in the history of present illness. She was evaluated in the context of the global COVID-19 pandemic, which necessitated consideration that the patient might be at risk for infection with the SARS-CoV-2 virus that causes COVID-19. Institutional protocols and algorithms that pertain to the evaluation of patients at risk for COVID-19 are in a state of rapid change based on information released by regulatory bodies including the CDC and federal and state organizations. These policies and algorithms were followed during the patient's care in the ED.  Patient presents for concern of elevated blood pressure readings on home monitoring machine.  Related patient her blood pressures only slightly elevated and might decrease more after her morning doses of medication take effect.  Advised to have her home monitoring machine compared with PCP office machine to see discrepancy in the readings.      ____________________________________________   FINAL CLINICAL IMPRESSION(S) / ED DIAGNOSES  Final diagnoses:  Essential hypertension     ED Discharge Orders    None       Note:  This document was prepared using Dragon voice recognition software and may include unintentional dictation errors.    Sable Feil, PA-C 01/07/19 1019    Arta Silence, MD 01/07/19 1112

## 2019-01-07 NOTE — ED Notes (Signed)
States has BP cuff at home and checks periodically, BP has been elevated recently, takes amlodipine.  Denies accompanying symptoms.

## 2019-01-07 NOTE — Discharge Instructions (Addendum)
Advised to compare home blood pressure monitoring machine with PCP office machine.  Continue previous medications.

## 2019-01-07 NOTE — ED Triage Notes (Signed)
Pt reports HTN for the last 2 days. Pt reports she used the wrist BP monitor and it read over 200 so she got worried. Pt denies any symptoms, reports feels fine was just concerned. Pt takes meds for BP daily

## 2019-02-15 ENCOUNTER — Other Ambulatory Visit: Payer: Self-pay | Admitting: Family Medicine

## 2019-02-15 DIAGNOSIS — D249 Benign neoplasm of unspecified breast: Secondary | ICD-10-CM

## 2019-05-23 ENCOUNTER — Other Ambulatory Visit: Payer: Self-pay

## 2019-06-28 ENCOUNTER — Encounter: Payer: Self-pay | Admitting: Emergency Medicine

## 2019-06-28 ENCOUNTER — Emergency Department
Admission: EM | Admit: 2019-06-28 | Discharge: 2019-06-28 | Disposition: A | Discharge status: Home / Self Care | Payer: Medicaid Other | Attending: Emergency Medicine | Admitting: Emergency Medicine

## 2019-06-28 ENCOUNTER — Emergency Department: Payer: Medicaid Other

## 2019-06-28 ENCOUNTER — Other Ambulatory Visit: Payer: Self-pay

## 2019-06-28 DIAGNOSIS — M7989 Other specified soft tissue disorders: Secondary | ICD-10-CM | POA: Insufficient documentation

## 2019-06-28 DIAGNOSIS — R0602 Shortness of breath: Secondary | ICD-10-CM | POA: Insufficient documentation

## 2019-06-28 DIAGNOSIS — R609 Edema, unspecified: Secondary | ICD-10-CM | POA: Diagnosis present

## 2019-06-28 LAB — URINALYSIS, COMPLETE (UACMP) WITH MICROSCOPIC
Bacteria, UA: NONE SEEN
Bilirubin Urine: NEGATIVE
Glucose, UA: NEGATIVE mg/dL
Ketones, ur: NEGATIVE mg/dL
Leukocytes,Ua: NEGATIVE
Nitrite: NEGATIVE
Protein, ur: NEGATIVE mg/dL
Specific Gravity, Urine: 1.014 (ref 1.005–1.030)
pH: 7 (ref 5.0–8.0)

## 2019-06-28 LAB — CBC WITH DIFFERENTIAL/PLATELET
Abs Immature Granulocytes: 0.01 10*3/uL (ref 0.00–0.07)
Basophils Absolute: 0.1 10*3/uL (ref 0.0–0.1)
Basophils Relative: 1 %
Eosinophils Absolute: 0.2 10*3/uL (ref 0.0–0.5)
Eosinophils Relative: 5 %
HCT: 40.5 % (ref 36.0–46.0)
Hemoglobin: 13.6 g/dL (ref 12.0–15.0)
Immature Granulocytes: 0 %
Lymphocytes Relative: 41 %
Lymphs Abs: 2.1 10*3/uL (ref 0.7–4.0)
MCH: 32.2 pg (ref 26.0–34.0)
MCHC: 33.6 g/dL (ref 30.0–36.0)
MCV: 95.7 fL (ref 80.0–100.0)
Monocytes Absolute: 0.4 10*3/uL (ref 0.1–1.0)
Monocytes Relative: 8 %
Neutro Abs: 2.3 10*3/uL (ref 1.7–7.7)
Neutrophils Relative %: 45 %
Platelets: 282 10*3/uL (ref 150–400)
RBC: 4.23 MIL/uL (ref 3.87–5.11)
RDW: 12.7 % (ref 11.5–15.5)
WBC: 5.1 10*3/uL (ref 4.0–10.5)
nRBC: 0 % (ref 0.0–0.2)

## 2019-06-28 LAB — TROPONIN I (HIGH SENSITIVITY)
Troponin I (High Sensitivity): 4 ng/L (ref <18)
Troponin I (High Sensitivity): 4 ng/L (ref <18)

## 2019-06-28 LAB — COMPREHENSIVE METABOLIC PANEL
ALT: 32 U/L (ref 0–44)
AST: 46 U/L — ABNORMAL HIGH (ref 15–41)
Albumin: 4.4 g/dL (ref 3.5–5.0)
Alkaline Phosphatase: 136 U/L — ABNORMAL HIGH (ref 38–126)
Anion gap: 11 (ref 5–15)
BUN: 9 mg/dL (ref 6–20)
CO2: 25 mmol/L (ref 22–32)
Calcium: 9.3 mg/dL (ref 8.9–10.3)
Chloride: 103 mmol/L (ref 98–111)
Creatinine, Ser: 0.56 mg/dL (ref 0.44–1.00)
GFR calc Af Amer: >60 mL/min (ref >60)
GFR calc non Af Amer: >60 mL/min (ref >60)
Glucose, Bld: 93 mg/dL (ref 70–99)
Potassium: 4.2 mmol/L (ref 3.5–5.1)
Sodium: 139 mmol/L (ref 135–145)
Total Bilirubin: 0.9 mg/dL (ref 0.3–1.2)
Total Protein: 8.7 g/dL — ABNORMAL HIGH (ref 6.5–8.1)

## 2019-06-28 LAB — BRAIN NATRIURETIC PEPTIDE: B Natriuretic Peptide: 27 pg/mL (ref 0.0–100.0)

## 2019-06-28 NOTE — ED Provider Notes (Signed)
[facility] Emergency Department Provider Note  ____________________________________________   First MD Initiated Contact with Patient 06/28/19 0825     (approximate)  I have reviewed the triage vital signs and the nursing notes.   HISTORY  Chief Complaint Leg swelling   HPI Catherine Richardson is a 54 y.o. female with hypertension who comes in with swelling in her ankles and feet for 1 week.  Patient states that she is had intermittent swelling in her bilateral ankles and feet.  She states that the swelling gets worse during the day after she has been on her feet for a long period of time.  She will elevate them at nighttime and the swelling will go down.  Her legs have the exact same size.  One leg is not bigger than the other.  There is no swelling at this time.  She states that it is all gone down since she woke up this morning.  She also reports some shortness of breath but states that that is been going on for 3 to 4 weeks.  Mild in nature worse with exertion.  She states that she is had a few episodes of near passing out where she will get hot all over, and she will have to have a bowel movement but does not actually pass out.  She is never been seen for 1 of these episodes.  She does drink alcohol she states too much was not able to give me the exact amount.  She also occasionally uses cocaine and last used it a few days ago.  She does report some depression which is why she uses drugs and alcohol but denies any SI and feels safe at home.  Patient also reports a bump on her right elbow that is been there for a few weeks as well.  She has had a prior cyst on her arm this need to be removed          Past Medical History:  Diagnosis Date  . Hypertension     Patient Active Problem List   Diagnosis Date Noted  . Chest pain 12/06/2014  . Dyspnea   . HTN (hypertension), malignant   . Cocaine abuse ([place])   . ETOH abuse   . Orthostatic hypotension      History reviewed. No pertinent surgical history.  Prior to Admission medications   Medication Sig Start Date End Date Taking? Authorizing Provider  amLODipine (NORVASC) 10 MG tablet Take 10 mg by mouth daily.    [provider]  atorvastatin (LIPITOR) 40 MG tablet Take 40 mg by mouth daily.    [provider]    Allergies Patient has no known allergies.  Family History  Problem Relation Age of Onset  . CAD Mother   . CAD Father   . Deep vein thrombosis Brother     Social History Social History   Tobacco Use  . Smoking status: Never Smoker  . Smokeless tobacco: Never Used  Substance Use Topics  . Alcohol use: Yes    Alcohol/week: 10.0 standard drinks    Types: 3 Shots of liquor, 7 Cans of beer per week  . Drug use: No      Review of Systems Constitutional: No fever/chills Eyes: No visual changes. ENT: No sore throat. Cardiovascular: Denies chest pain. Respiratory: Positive shortness of breath Gastrointestinal: No abdominal pain.  No nausea, no vomiting.  No diarrhea.  No constipation. Genitourinary: Negative for dysuria. Musculoskeletal: Negative for back pain.  Positive  leg swelling, bump on right arm Skin: Negative for rash. Neurological: Negative for headaches, focal weakness or numbness. All other ROS negative ____________________________________________   PHYSICAL EXAM:  VITAL SIGNS: Blood pressure (!) 162/84, pulse 99, temperature 98.7 F (37.1 C), temperature source Oral, resp. rate 15, height 5\' 8"  (1.727 m), weight 71.2 kg, SpO2 99 %.   Constitutional: Alert and oriented. Well appearing and in no acute distress. Eyes: Conjunctivae are normal. EOMI. Head: Atraumatic. Nose: No congestion/rhinnorhea. Mouth/Throat: Mucous membranes are moist.   Neck: No stridor. Trachea Midline. FROM Cardiovascular: Normal rate, regular rhythm. Grossly normal heart sounds.  Good peripheral circulation. Respiratory: Normal respiratory effort.   No retractions. Lungs CTAB. Gastrointestinal: Soft and nontender. No distention. No abdominal bruits.  Musculoskeletal: No lower extremity tenderness nor edema.  No joint effusions.  2+ distal pulses.  Hard protuberance protuberance noted on the right elbow without any erythema or fluctuation and full range of motion of the joint Neurologic:  Normal speech and language. No gross focal neurologic deficits are appreciated.  Skin:  Skin is warm, dry and intact. No rash noted. Psychiatric: Mood and affect are normal. Speech and behavior are normal. GU: Deferred   ____________________________________________   LABS (all labs ordered are listed, but only abnormal results are displayed)  Labs Reviewed  COMPREHENSIVE METABOLIC PANEL - Abnormal; Notable for the following components:      Result Value   Total Protein 8.7 (*)    AST 46 (*)    Alkaline Phosphatase 136 (*)    All other components within normal limits  CBC WITH DIFFERENTIAL/PLATELET  BRAIN NATRIURETIC PEPTIDE  URINALYSIS, COMPLETE (UACMP) WITH MICROSCOPIC  TROPONIN I (HIGH SENSITIVITY)  TROPONIN I (HIGH SENSITIVITY)   ____________________________________________   ED ECG REPORT I, Vanessa Pierce, the attending physician, personally viewed and interpreted this ECG.  EKG is normal sinus rate of 92, no ST elevation, no T wave inversions, normal intervals ____________________________________________  RADIOLOGY Robert Bellow, personally viewed and evaluated these images (plain radiographs) as part of my medical decision making, as well as reviewing the written report by the radiologist.  ED MD interpretation: Chest x-ray negative  Official radiology report(s): DG Chest 2 View  Result Date: 06/28/2019 CLINICAL DATA:  Shortness of breath and lower extremity swelling for 1 week. EXAM: CHEST - 2 VIEW COMPARISON:  02/06/2017 FINDINGS: The heart size and mediastinal contours are within normal limits. Both lungs are clear. No  evidence of pulmonary edema, infiltrate, or pleural effusion. The visualized skeletal structures are unremarkable. IMPRESSION: No active cardiopulmonary disease. Electronically Signed   By: Marlaine Hind M.D.   On: 06/28/2019 09:17   DG Elbow Complete Right  Result Date: 06/28/2019 CLINICAL DATA:  Protrusion from right elbow. No known injury. EXAM: RIGHT ELBOW - COMPLETE 3+ VIEW COMPARISON:  None. FINDINGS: There is no evidence of fracture, dislocation, or joint effusion. Mild degenerative spurring is seen involving the ulna. No significant joint space narrowing. Mild enthesopathic changes seen involving the lateral epicondyle. Soft tissues are unremarkable. IMPRESSION: No acute findings. Mild degenerative spurring of ulna. Mild enthesopathy involving the lateral epicondyle. Electronically Signed   By: Marlaine Hind M.D.   On: 06/28/2019 09:25   US Venous Img Lower Bilateral  Result Date: 06/28/2019 CLINICAL DATA:  54 year old female with bilateral lower extremity edema EXAM: BILATERAL LOWER EXTREMITY VENOUS DOPPLER ULTRASOUND TECHNIQUE: Gray-scale sonography with graded compression, as well as color Doppler and duplex ultrasound were performed to evaluate the lower extremity deep venous systems from  the level of the common femoral vein and including the common femoral, femoral, profunda femoral, popliteal and calf veins including the posterior tibial, peroneal and gastrocnemius veins when visible. The superficial great saphenous vein was also interrogated. Spectral Doppler was utilized to evaluate flow at rest and with distal augmentation maneuvers in the common femoral, femoral and popliteal veins. COMPARISON:  None. FINDINGS: RIGHT LOWER EXTREMITY Common Femoral Vein: No evidence of thrombus. Normal compressibility, respiratory phasicity and response to augmentation. Saphenofemoral Junction: No evidence of thrombus. Normal compressibility and flow on color Doppler imaging. Profunda Femoral Vein: No evidence  of thrombus. Normal compressibility and flow on color Doppler imaging. Femoral Vein: No evidence of thrombus. Normal compressibility, respiratory phasicity and response to augmentation. Popliteal Vein: No evidence of thrombus. Normal compressibility, respiratory phasicity and response to augmentation. Calf Veins: No evidence of thrombus. Normal compressibility and flow on color Doppler imaging. Superficial Great Saphenous Vein: No evidence of thrombus. Normal compressibility. Venous Reflux:  None. Other Findings:  None. LEFT LOWER EXTREMITY Common Femoral Vein: No evidence of thrombus. Normal compressibility, respiratory phasicity and response to augmentation. Saphenofemoral Junction: No evidence of thrombus. Normal compressibility and flow on color Doppler imaging. Profunda Femoral Vein: No evidence of thrombus. Normal compressibility and flow on color Doppler imaging. Femoral Vein: No evidence of thrombus. Normal compressibility, respiratory phasicity and response to augmentation. Popliteal Vein: No evidence of thrombus. Normal compressibility, respiratory phasicity and response to augmentation. Calf Veins: No evidence of thrombus. Normal compressibility and flow on color Doppler imaging. Superficial Great Saphenous Vein: No evidence of thrombus. Normal compressibility. Venous Reflux:  None. Other Findings:  None. IMPRESSION: No evidence of deep venous thrombosis in either lower extremity. Electronically Signed   By: Jacqulynn Cadet M.D.   On: 06/28/2019 11:44    ____________________________________________   PROCEDURES  Procedure(s) performed (including Critical Care):  Procedures   ____________________________________________   INITIAL IMPRESSION / ASSESSMENT AND PLAN / ED COURSE  Catherine Richardson was evaluated in Emergency Department on 06/28/2019 for the symptoms described in the history of present illness. She was evaluated in the context of the global COVID-19 pandemic, which necessitated  consideration that the patient might be at risk for infection with the SARS-CoV-2 virus that causes COVID-19. Institutional protocols and algorithms that pertain to the evaluation of patients at risk for COVID-19 are in a state of rapid change based on information released by regulatory bodies including the CDC and federal and state organizations. These policies and algorithms were followed during the patient's care in the ED.     Patient is a well-appearing 54 year old who comes in hypertensive with swelling in the ankles and feet.  Patient has not taken her blood pressure medicine for 2 days because she ran out but states that she is picking it up today at 11 AM.  Patient's biggest concern is the swelling in her bilateral legs.  One leg was not bigger than the other.  This time there is no swelling elevated.  She had no pain in the calf.  I have low suspicion for DVT at this time.  Sounds more like venous insufficiency given her symptoms are resolving at nighttime and get worse with exercise and being on her feet.  We discussed compression socks and keeping her legs elevated at nighttime.  However given her history of cocaine and alcohol abuse also get chest x-ray and BNP to evaluate for heart failure, LFTs evaluate for liver failure and urine to evaluate for nephrotic syndrome.  For patient's elbow  there is a hard protuberance noted but I do not see any signs of abscess, cellulitis, septic joint.  Will add on x-ray to further evaluate  Labs are reassuring.  No significant signs of kidney dysfunction, slight elevation of summer LFTs but no signs of severe liver issues, BNP is normal, chest x-ray without edema.  Patient is ambulating with saturations that are 98 to 100%.  I have very low suspicion for PE she denies any other risk factors for PE.  Will get ultrasounds of her legs do not feel like a ton of swelling now patient does report that occasionally the left swelling is worse than the right swelling.   Nurse saw patient after she had been up for a while and did state that there was a little bit of trace edema bilaterally.  Ultrasounds were negative bilaterally.  I suspect this is more likely venous insufficiency.  We discussed compression socks and keeping legs elevated.  She can follow-up for echocardiogram if symptoms are getting worsening  Her x-ray of the elbow shows a spur of her ulna as well as some enthesopathy the patient upon follow-up with her primary doctor  ____________________________________________   FINAL CLINICAL IMPRESSION(S) / ED DIAGNOSES   Final diagnoses:  Leg swelling      MEDICATIONS GIVEN DURING THIS VISIT:  Medications - No data to display   ED Discharge Orders    None       Note:  This document was prepared using Dragon voice recognition software and may include unintentional dictation errors.   Vanessa Avon, MD 06/28/19 1212

## 2019-06-28 NOTE — Discharge Instructions (Addendum)
Ultrasounds, labs, chest x-ray are reassuring.  Most likely secondary to venous insufficiency.  Keep your legs elevated and wear compression socks during the day. follow-up with your primary doctor.  You most likely do not need an echocardiogram but they can further discuss it with you.  You do have slight irregularity in some of your liver function test but nothing that is dangerous.  Cut down your drinking and have these repeated with your primary doctor as well    No acute findings.   Mild degenerative spurring of ulna. Mild enthesopathy involving the  lateral epicondyle.

## 2019-06-28 NOTE — ED Notes (Signed)
Pt ambulatory with pulse ox. Oxygen saturation remained at 98-100% while up ambulating in room.

## 2019-06-28 NOTE — ED Triage Notes (Signed)
Swelling to feet and ankles x 1 week.  Also C/O SOB.    AAOx3.  Skin warm and dry. NAD

## 2019-07-29 ENCOUNTER — Other Ambulatory Visit: Payer: Self-pay

## 2019-07-29 ENCOUNTER — Encounter: Payer: Self-pay | Admitting: Emergency Medicine

## 2019-07-29 ENCOUNTER — Emergency Department
Admission: EM | Admit: 2019-07-29 | Discharge: 2019-07-30 | Disposition: A | Discharge status: Home / Self Care | Payer: Medicaid Other | Attending: Emergency Medicine | Admitting: Emergency Medicine

## 2019-07-29 DIAGNOSIS — R55 Syncope and collapse: Secondary | ICD-10-CM | POA: Diagnosis present

## 2019-07-29 DIAGNOSIS — I10 Essential (primary) hypertension: Secondary | ICD-10-CM | POA: Diagnosis not present

## 2019-07-29 HISTORY — DX: Pure hypercholesterolemia, unspecified: E78.00

## 2019-07-29 HISTORY — DX: Syncope and collapse: R55

## 2019-07-29 LAB — CBC WITH DIFFERENTIAL/PLATELET
Abs Immature Granulocytes: 0.01 10*3/uL (ref 0.00–0.07)
Basophils Absolute: 0 10*3/uL (ref 0.0–0.1)
Basophils Relative: 1 %
Eosinophils Absolute: 0.2 10*3/uL (ref 0.0–0.5)
Eosinophils Relative: 3 %
HCT: 36.1 % (ref 36.0–46.0)
Hemoglobin: 12.1 g/dL (ref 12.0–15.0)
Immature Granulocytes: 0 %
Lymphocytes Relative: 36 %
Lymphs Abs: 2.1 10*3/uL (ref 0.7–4.0)
MCH: 32.5 pg (ref 26.0–34.0)
MCHC: 33.5 g/dL (ref 30.0–36.0)
MCV: 97 fL (ref 80.0–100.0)
Monocytes Absolute: 0.5 10*3/uL (ref 0.1–1.0)
Monocytes Relative: 9 %
Neutro Abs: 3 10*3/uL (ref 1.7–7.7)
Neutrophils Relative %: 51 %
Platelets: 269 10*3/uL (ref 150–400)
RBC: 3.72 MIL/uL — ABNORMAL LOW (ref 3.87–5.11)
RDW: 12.6 % (ref 11.5–15.5)
WBC: 5.8 10*3/uL (ref 4.0–10.5)
nRBC: 0 % (ref 0.0–0.2)

## 2019-07-29 LAB — BASIC METABOLIC PANEL
Anion gap: 13 (ref 5–15)
BUN: 13 mg/dL (ref 6–20)
CO2: 24 mmol/L (ref 22–32)
Calcium: 9 mg/dL (ref 8.9–10.3)
Chloride: 103 mmol/L (ref 98–111)
Creatinine, Ser: 0.79 mg/dL (ref 0.44–1.00)
GFR calc Af Amer: >60 mL/min (ref >60)
GFR calc non Af Amer: >60 mL/min (ref >60)
Glucose, Bld: 106 mg/dL — ABNORMAL HIGH (ref 70–99)
Potassium: 3.1 mmol/L — ABNORMAL LOW (ref 3.5–5.1)
Sodium: 140 mmol/L (ref 135–145)

## 2019-07-29 MED ORDER — SODIUM CHLORIDE 0.9 % IV BOLUS: 500.0000 mL | Freq: Once | INTRAVENOUS | Status: DC

## 2019-07-29 NOTE — Discharge Instructions (Addendum)
Return to the ER for new, worsening, recurrent dizziness or lightheadedness, feeling like you are going to pass out, or any other new or worsening symptoms that concern you.  Follow-up with your regular doctor.

## 2019-07-29 NOTE — ED Provider Notes (Signed)
[facility] Emergency Department Provider Note ____________________________________________   First MD Initiated Contact with Patient 07/29/19 1115     (approximate)  I have reviewed the triage vital signs and the nursing notes.   HISTORY  Chief Complaint Near Syncope    HPI Catherine Richardson is a 54 y.o. female with PMH as noted below who presents with near syncope, acute onset this morning when the patient was at a graduation ceremony.  She was standing outside for some time and states that she began to feel lightheaded, hot, nauseated.  She then sat down and feels like she almost passed out but did not lose consciousness.  She had one episode of vomiting.  She denies associated chest pain, shortness of breath, or palpitations.  The patient states she did not eat breakfast, has not been drinking much water, and also did have some alcohol last night.  She states that she has had 1 or 2 episodes like this over the last few months.  Past Medical History:  Diagnosis Date  . High cholesterol   . Hypertension   . Near syncope     Patient Active Problem List   Diagnosis Date Noted  . Chest pain 12/06/2014  . Dyspnea   . HTN (hypertension), malignant   . Cocaine abuse ([place])   . ETOH abuse   . Orthostatic hypotension     History reviewed. No pertinent surgical history.  Prior to Admission medications   Medication Sig Start Date End Date Taking? Authorizing Provider  amLODipine (NORVASC) 10 MG tablet Take 10 mg by mouth daily.    [provider]  atorvastatin (LIPITOR) 40 MG tablet Take 40 mg by mouth daily.    [provider]    Allergies Patient has no known allergies.  Family History  Problem Relation Age of Onset  . CAD Mother   . CAD Father   . Deep vein thrombosis Brother     Social History Social History   Tobacco Use  . Smoking status: Never Smoker  . Smokeless tobacco: Never Used  Substance Use Topics  . Alcohol  use: Yes    Alcohol/week: 10.0 standard drinks    Types: 3 Shots of liquor, 7 Cans of beer per week  . Drug use: No    Review of Systems  Constitutional: No fever/chills. Eyes: No visual changes. ENT: No sore throat. Cardiovascular: Denies chest pain. Respiratory: Denies shortness of breath. Gastrointestinal: Positive for resolved vomiting. Genitourinary: Negative for dysuria.  Musculoskeletal: Negative for back pain. Skin: Negative for rash. Neurological: Negative for headache.   ____________________________________________   PHYSICAL EXAM:  VITAL SIGNS: ED Triage Vitals  Enc Vitals Group     BP 07/29/19 1110 (!) 117/92     Pulse Rate 07/29/19 1110 75     Resp 07/29/19 1110 18     Temp 07/29/19 1110 97.7 F (36.5 C)     Temp Source 07/29/19 1110 Oral     SpO2 07/29/19 1110 100 %     Weight 07/29/19 1111 163 lb (73.9 kg)     Height 07/29/19 1111 5\' 8"  (1.727 m)     Head Circumference --      Peak Flow --      Pain Score 07/29/19 1111 0     Pain Loc --      Pain Edu? --      Excl. in [place]? --     Constitutional: Alert and oriented. Well appearing and in no acute distress.  Eyes: Conjunctivae are normal.  EOMI. Head: Atraumatic. Nose: No congestion/rhinnorhea. Mouth/Throat: Mucous membranes are moist.   Neck: Normal range of motion.  Cardiovascular: Normal rate, regular rhythm. Good peripheral circulation. Respiratory: Normal respiratory effort.  No retractions.  Gastrointestinal: No distention.  Musculoskeletal:  Extremities warm and well perfused.  Neurologic:  Normal speech and language.  Motor intact in all extremities.  Normal coordination.  No gross focal neurologic deficits are appreciated.  Skin:  Skin is warm and dry. No rash noted. Psychiatric: Mood and affect are normal. Speech and behavior are normal.  ____________________________________________   LABS (all labs ordered are listed, but only abnormal results are displayed)  Labs Reviewed  BASIC  METABOLIC PANEL - Abnormal; Notable for the following components:      Result Value   Potassium 3.1 (*)    Glucose, Bld 106 (*)    All other components within normal limits  CBC WITH DIFFERENTIAL/PLATELET - Abnormal; Notable for the following components:   RBC 3.72 (*)    All other components within normal limits   ____________________________________________  EKG  ED ECG REPORT I, Arta Silence, the attending physician, personally viewed and interpreted this ECG.  Date: 07/29/2019 EKG Time: 1110 Rate: 76 Rhythm: normal sinus rhythm QRS Axis: normal Intervals: normal ST/T Wave abnormalities: normal Narrative Interpretation: no evidence of acute ischemia  ____________________________________________  RADIOLOGY    ____________________________________________   PROCEDURES  Procedure(s) performed: No  Procedures  Critical Care performed: No ____________________________________________   INITIAL IMPRESSION / ASSESSMENT AND PLAN / ED COURSE  Pertinent labs & imaging results that were available during my care of the patient were reviewed by me and considered in my medical decision making (see chart for details).  54 year old female with PMH as noted above presents with an episode of near syncope while she was standing outside for a high school graduation ceremony.  The patient states she drank some alcohol last night and did not eat breakfast this morning.  She reports having 1 or 2 episodes like this in the last few months.  On exam, she is overall well-appearing.  Her vital signs are normal.  The physical exam is unremarkable.  Neurologic exam is nonfocal.  EKG shows no acute findings.  Overall presentation is consistent with vasovagal near syncope, likely precipitated by dehydration and/or the outside conditions as it is quite humid and warm today.  We will obtain basic labs, give a small fluid bolus, and  reassess.  ----------------------------------------- 1:57 PM on 07/29/2019 -----------------------------------------  Lab work-up is unremarkable except for borderline hypokalemia.  The patient continues to be comfortable and is asymptomatic.  She is stable for discharge home.  I counseled her on the results of the work-up.  Return precautions given, and she expresses understanding.  ____________________________________________   FINAL CLINICAL IMPRESSION(S) / ED DIAGNOSES  Final diagnoses:  Near syncope      NEW MEDICATIONS STARTED DURING THIS VISIT:  New Prescriptions   No medications on file     Note:  This document was prepared using Dragon voice recognition software and may include unintentional dictation errors.   Arta Silence, MD 07/29/19 1610

## 2019-07-29 NOTE — ED Notes (Signed)
Lab called regarding orders for blood work, IV therapy initiated by this Therapist, sports. Pt visualized in NAD, resting in bed speaking with family member at this time.

## 2019-07-29 NOTE — ED Triage Notes (Signed)
Pt presents to ED via [facility] from high school graduation. Per EMS pt had near syncopal episode while outside at graduation, EMS reports pt got hot and vomited and become diaphoretic. EMS reports per patient she had had decreased water intake, and had ETOH intake last night and has not eaten today. Pt presents to ED A&O x4, VSS.   97.6 133/79 80 NSR  20g to L hand 4mg  Zofran given PTA

## 2019-07-29 NOTE — ED Notes (Signed)
Pt alert and oriented X 4, stable for discharge. RR even and unlabored, color WNL. Discussed discharge instructions and follow up when appropriate. Instructed to follow up with ER for any life threatening symptoms or concerns that patient or family of patient may have  

## 2019-07-30 NOTE — ED Notes (Signed)
Pt moved off of the floor when dispo occurred. This RN taking pt off of the board at this time and using pain acuity reported in triage.

## 2020-03-14 ENCOUNTER — Emergency Department
Admission: EM | Admit: 2020-03-14 | Discharge: 2020-03-14 | Disposition: A | Discharge status: Home / Self Care | Payer: Medicaid Other | Attending: Emergency Medicine | Admitting: Emergency Medicine

## 2020-03-14 ENCOUNTER — Emergency Department: Payer: Medicaid Other

## 2020-03-14 ENCOUNTER — Other Ambulatory Visit: Payer: Self-pay

## 2020-03-14 DIAGNOSIS — I10 Essential (primary) hypertension: Secondary | ICD-10-CM | POA: Insufficient documentation

## 2020-03-14 DIAGNOSIS — U071 COVID-19: Secondary | ICD-10-CM | POA: Insufficient documentation

## 2020-03-14 DIAGNOSIS — Z79899 Other long term (current) drug therapy: Secondary | ICD-10-CM | POA: Insufficient documentation

## 2020-03-14 DIAGNOSIS — Z20822 Contact with and (suspected) exposure to covid-19: Secondary | ICD-10-CM

## 2020-03-14 DIAGNOSIS — R059 Cough, unspecified: Secondary | ICD-10-CM | POA: Diagnosis present

## 2020-03-14 MED ORDER — DEXAMETHASONE 6 MG PO TABS: 6.0000 mg | ORAL_TABLET | Freq: Every day | ORAL | 0 refills | Status: DC

## 2020-03-14 MED ORDER — AZITHROMYCIN 250 MG PO TABS: ORAL_TABLET | ORAL | 0 refills | Status: DC

## 2020-03-14 NOTE — ED Provider Notes (Signed)
[facility] Emergency Department Provider Note  ____________________________________________   Event Date/Time   First MD Initiated Contact with Patient 03/14/20 1512     (approximate)  I have reviewed the triage vital signs and the nursing notes.   HISTORY  Chief Complaint Fever    HPI Catherine Richardson is a 55 y.o. female presents to the emergency department with URI symptoms for 3 days.   Is complaining of cough, congestion, denies fever, positive chills, denies chest pain, denies shortness of breath unsure close contact with Covid19+ patient as her daughter has been coughing but did not get tested, patient is not vaccinated.   Past Medical History:  Diagnosis Date  . High cholesterol   . Hypertension   . Near syncope     Patient Active Problem List   Diagnosis Date Noted  . Chest pain 12/06/2014  . Dyspnea   . HTN (hypertension), malignant   . Cocaine abuse ([place])   . ETOH abuse   . Orthostatic hypotension     No past surgical history on file.  Prior to Admission medications   Medication Sig Start Date End Date Taking? Authorizing Provider  azithromycin (ZITHROMAX Z-PAK) 250 MG tablet 2 pills today then 1 pill a day for 4 days 03/14/20  Yes Rebeka Kimble, Linden Dolin, PA-C  dexamethasone (DECADRON) 6 MG tablet Take 1 tablet (6 mg total) by mouth daily. 03/14/20  Yes Vikrant Pryce, Linden Dolin, PA-C  amLODipine (NORVASC) 10 MG tablet Take 10 mg by mouth daily.    [provider]  atorvastatin (LIPITOR) 40 MG tablet Take 40 mg by mouth daily.    [provider]    Allergies Patient has no known allergies.  Family History  Problem Relation Age of Onset  . CAD Mother   . CAD Father   . Deep vein thrombosis Brother     Social History Social History   Tobacco Use  . Smoking status: Never Smoker  . Smokeless tobacco: Never Used  Substance Use Topics  . Alcohol use: Yes    Alcohol/week: 10.0 standard drinks    Types: 3 Shots of liquor,  7 Cans of beer per week  . Drug use: No    Review of Systems  Constitutional: Positive fever/chills Eyes: No visual changes. ENT: Denies sore throat. Respiratory: Positive cough Cardiovascular: Denies chest pain Gastrointestinal: Denies abdominal pain Genitourinary: Negative for dysuria. Musculoskeletal: Negative for back pain. Skin: Negative for rash. Neurological: Denies neurological changes    ____________________________________________   PHYSICAL EXAM:  VITAL SIGNS: ED Triage Vitals  Enc Vitals Group     BP 03/14/20 1345 137/71     Pulse Rate 03/14/20 1345 90     Resp 03/14/20 1345 18     Temp 03/14/20 1345 98 F (36.7 C)     Temp Source 03/14/20 1345 Oral     SpO2 03/14/20 1345 100 %     Weight 03/14/20 1346 148 lb 5 oz (67.3 kg)     Height 03/14/20 1346 5\' 8"  (1.727 m)     Head Circumference --      Peak Flow --      Pain Score 03/14/20 1346 0     Pain Loc --      Pain Edu? --      Excl. in [place]? --     Constitutional: Alert and oriented. Well appearing and in no acute distress. Eyes: Conjunctivae are normal.  Head: Atraumatic. Nose: No congestion/rhinnorhea. Mouth/Throat: Mucous membranes are moist.  Neck:  supple no lymphadenopathy noted Cardiovascular: Normal rate, regular rhythm. Heart sounds are normal Respiratory: Normal respiratory effort.  No retractions, lungs CTA GU: deferred Musculoskeletal: FROM all extremities, warm and well perfused Neurologic:  Normal speech and language.  Skin:  Skin is warm, dry and intact. No rash noted. Psychiatric: Mood and affect are normal. Speech and behavior are normal.  ____________________________________________   LABS (all labs ordered are listed, but only abnormal results are displayed)  Labs Reviewed  SARS CORONAVIRUS 2 (TAT 6-24 HRS)   ____________________________________________   ____________________________________________  RADIOLOGY  Chest  x-ray  ____________________________________________   PROCEDURES  Procedure(s) performed: No  Procedures    ____________________________________________   INITIAL IMPRESSION / ASSESSMENT AND PLAN / ED COURSE  Pertinent labs & imaging results that were available during my care of the patient were reviewed by me and considered in my medical decision making (see chart for details).   Patient is a 55 year old unvaccinated female who complains of URI symptoms.  Exam is consistent with covid.    Pending test for covid Chest x-ray shows hazy areas which may be the beginning of multifocal covid pneumonia  I did discuss findings with the patient.  Due to the concerns of a secondary pneumonia she was given Decadron and a Z-Pak.  She is to follow-up with her regular doctor if not improving in 2 to 3 days.  Return emergency department worsening.  I did caution her if she is unvaccinated that she may worsen and if she is having difficulty breathing should return immediately.  She states she understands.  She was discharged in stable condition with a work note.    The patient was instructed to quarantine themselves at home.  Follow-up with your regular doctor if any concerns.  Return emergency department for worsening. OTC measures discussed     Catherine Richardson was evaluated in Emergency Department on 03/14/2020 for the symptoms described in the history of present illness. She was evaluated in the context of the global COVID-19 pandemic, which necessitated consideration that the patient might be at risk for infection with the SARS-CoV-2 virus that causes COVID-19. Institutional protocols and algorithms that pertain to the evaluation of patients at risk for COVID-19 are in a state of rapid change based on information released by regulatory bodies including the CDC and federal and state organizations. These policies and algorithms were followed during the patient's care in the ED.   As part of my  medical decision making, I reviewed the following data within the [place] notes reviewed and incorporated, Old chart reviewed, Radiograph reviewed , Notes from prior ED visits and [place] Controlled Substance Database  ____________________________________________   FINAL CLINICAL IMPRESSION(S) / ED DIAGNOSES  Final diagnoses:  Suspected COVID-19 virus infection      NEW MEDICATIONS STARTED DURING THIS VISIT:  New Prescriptions   AZITHROMYCIN (ZITHROMAX Z-PAK) 250 MG TABLET    2 pills today then 1 pill a day for 4 days   DEXAMETHASONE (DECADRON) 6 MG TABLET    Take 1 tablet (6 mg total) by mouth daily.     Note:  This document was prepared using Dragon voice recognition software and may include unintentional dictation errors.    Versie Starks, PA-C 03/14/20 1547    Naaman Plummer, MD 03/15/20 [phone]

## 2020-03-14 NOTE — ED Triage Notes (Signed)
Pt here with covid like symptoms that started 3 days ago. Pt has been having cough, chills, and nausea. Pt in NAD in triage.

## 2020-03-14 NOTE — Discharge Instructions (Signed)

## 2020-03-14 NOTE — ED Notes (Signed)
See triage note  Presents with cough,nausea and chills  States sx's started about 3 days ago   Afebrile on arrival

## 2020-03-15 LAB — SARS CORONAVIRUS 2 (TAT 6-24 HRS): SARS Coronavirus 2: POSITIVE — AB

## 2020-03-16 ENCOUNTER — Encounter: Payer: Self-pay | Admitting: Nurse Practitioner

## 2020-03-16 ENCOUNTER — Other Ambulatory Visit: Payer: Self-pay | Admitting: Nurse Practitioner

## 2020-03-16 DIAGNOSIS — I10 Essential (primary) hypertension: Secondary | ICD-10-CM

## 2020-03-16 DIAGNOSIS — U071 COVID-19: Secondary | ICD-10-CM

## 2020-03-16 MED ORDER — MOLNUPIRAVIR EUA 200MG CAPSULE: 4.0000 | ORAL_CAPSULE | Freq: Two times a day (BID) | ORAL | 0 refills | Status: AC

## 2020-03-16 NOTE — Progress Notes (Signed)
Outpatient Oral COVID Treatment Note  I connected with Catherine Richardson on 03/16/2020/8:46 AM by telephone and verified that I am speaking with the correct person using two identifiers.  I discussed the limitations, risks, security, and privacy concerns of performing an evaluation and management service by telephone and the availability of in person appointments. I also discussed with the patient that there may be a patient responsible charge related to this service. The patient expressed understanding and agreed to proceed.  Patient location: Home Provider location: Via phone   Diagnosis: COVID-19 infection  Purpose of visit: Discussion of potential use of Molnupiravir or Paxlovid, a new treatment for mild to moderate COVID-19 viral infection in non-hospitalized patients.   Subjective: Patient is a 55 y.o. female who has been diagnosed with COVID 19 viral infection.  Their symptoms began on 03/13/20 with cough, fever, chills, and nausea.  Past Medical History:  Diagnosis Date   High cholesterol    Hypertension    Near syncope     No Known Allergies   Current Outpatient Medications:    amLODipine (NORVASC) 10 MG tablet, Take 10 mg by mouth daily., Disp: , Rfl:    atorvastatin (LIPITOR) 40 MG tablet, Take 40 mg by mouth daily., Disp: , Rfl:    azithromycin (ZITHROMAX Z-PAK) 250 MG tablet, 2 pills today then 1 pill a day for 4 days, Disp: 6 each, Rfl: 0   dexamethasone (DECADRON) 6 MG tablet, Take 1 tablet (6 mg total) by mouth daily., Disp: 7 tablet, Rfl: 0  Objective: Patient appears/sounds congested, alert and oriented. They are in no apparent distress.  Breathing is non labored.  Mood and behavior are normal.  Laboratory Data:  Recent Results (from the past 2160 hour(s))  SARS CORONAVIRUS 2 (TAT 6-24 HRS) Nasopharyngeal Nasopharyngeal Swab     Status: Abnormal   Collection Time: 03/14/20  3:24 PM   Specimen: Nasopharyngeal Swab  Result Value Ref Range   SARS Coronavirus 2  POSITIVE (A) NEGATIVE    Comment: (NOTE) SARS-CoV-2 target nucleic acids are DETECTED.  The SARS-CoV-2 RNA is generally detectable in upper and lower respiratory specimens during the acute phase of infection. Positive results are indicative of the presence of SARS-CoV-2 RNA. Clinical correlation with patient history and other diagnostic information is  necessary to determine patient infection status. Positive results do not rule out bacterial infection or co-infection with other viruses.  The expected result is Negative.  Fact Sheet for Patients: SugarRoll.be  Fact Sheet for Healthcare Providers: https://www.woods-mathews.com/  This test is not yet approved or cleared by the [place] FDA and  has been authorized for detection and/or diagnosis of SARS-CoV-2 by FDA under an Emergency Use Authorization (EUA). This EUA will remain  in effect (meaning this test can be used) for the duration of the COVID-19 declaration under Section 564(b)(1) of the Act, 21 U. S.C. section 360bbb-3(b)(1), unless the authorization is terminated or revoked sooner.   Performed at [facility], [place] [address] [place], [place] [address]      Assessment: 55 y.o. female with mild/moderate COVID 19 viral infection diagnosed on 03/14/20 at high risk for progression to severe COVID 19.  Plan:  This patient is a 55 y.o. female that meets the following criteria for Emergency Use Authorization of: Molnupiravir  1. Age >18 yr 2. SARS-COV-2 positive test 3. Symptom onset < 5 days 4. Mild-to-moderate COVID disease with high risk for severe progression to hospitalization or death   I have spoken and  communicated the following to the patient or parent/caregiver regarding: 1. Molnupiravir is an unapproved drug that is authorized for use under an [employer] production planner.  2. There are no adequate, approved, available products for the treatment of COVID-19 in  adults who have mild-to-moderate COVID-19 and are at high risk for progressing to severe COVID-19, including hospitalization or death. 3. Other therapeutics are currently authorized. For additional information on all products authorized for treatment or prevention of COVID-19, please see TanEmporium.pl.  4. There are benefits and risks of taking this treatment as outlined in the Fact Sheet for Patients and Caregivers.  5. Fact Sheet for Patients and Caregivers was reviewed with patient. A hard copy will be provided to patient from pharmacy prior to the patient receiving treatment. 6. Patients should continue to self-isolate and use infection control measures (e.g., wear mask, isolate, social distance, avoid sharing personal items, clean and disinfect high touch surfaces, and frequent handwashing) according to CDC guidelines.  7. The patient or parent/caregiver has the option to accept or refuse treatment. 8. [place] has established a pregnancy surveillance program. 9. Females of childbearing potential should use a reliable method of contraception correctly and consistently, as applicable, for the duration of treatment and for 4 days after the last dose of Molnupiravir. 77. Males of reproductive potential who are sexually active with females of childbearing potential should use a reliable method of contraception correctly and consistently during treatment and for at least 3 months after the last dose. 11. Pregnancy status and risk was assessed. Patient verbalized understanding of precautions.   After reviewing above information with the patient, the patient agrees to receive molnupiravir.  Follow up instructions:     Take prescription BID x 5 days as directed  Reach out to pharmacist for counseling on medication if desired  For concerns regarding further COVID symptoms  please follow up with your PCP or urgent care  For urgent or life-threatening issues, seek care at your local emergency department  The patient was provided an opportunity to ask questions, and all were answered. The patient agreed with the plan and demonstrated an understanding of the instructions.   Script sent to [facility] and opted to pick up RX.  The patient was advised to call their PCP or seek an in-person evaluation if the symptoms worsen or if the condition fails to improve as anticipated.   I provided 30 minutes of non face-to-face telephone visit time during this encounter, and > 50% was spent counseling as documented under my assessment & plan.  Jobe Gibbon, NP 03/16/2020 /8:46 AM

## 2020-03-19 ENCOUNTER — Telehealth: Payer: Self-pay

## 2020-03-19 NOTE — Telephone Encounter (Signed)
Patient was prescribed oral covid treatment  Molnupiravir and treatment note was reviewed. Medication has been received by [name] and reviewed for appropriateness.  Drug Interactions or Dosage Adjustments Noted: none  Delivery Method: Pick-up  Patient contacted for counseling on 03/16/20 and verbalized understanding.   Delivery or Pick-Up Date: 03/16/20   Catherine Richardson 03/19/2020, 9:10 AM [facility] Outpatient Pharmacist Phone# [phone]

## 2021-03-11 ENCOUNTER — Other Ambulatory Visit: Payer: Self-pay

## 2021-03-11 ENCOUNTER — Emergency Department
Admission: EM | Admit: 2021-03-11 | Discharge: 2021-03-11 | Disposition: A | Discharge status: Home / Self Care | Payer: Medicaid Other | Attending: Emergency Medicine | Admitting: Emergency Medicine

## 2021-03-11 DIAGNOSIS — R55 Syncope and collapse: Secondary | ICD-10-CM | POA: Insufficient documentation

## 2021-03-11 DIAGNOSIS — R059 Cough, unspecified: Secondary | ICD-10-CM | POA: Insufficient documentation

## 2021-03-11 DIAGNOSIS — R110 Nausea: Secondary | ICD-10-CM | POA: Diagnosis not present

## 2021-03-11 DIAGNOSIS — Z20822 Contact with and (suspected) exposure to covid-19: Secondary | ICD-10-CM | POA: Diagnosis not present

## 2021-03-11 LAB — BASIC METABOLIC PANEL
Anion gap: 8 (ref 5–15)
BUN: 23 mg/dL — ABNORMAL HIGH (ref 6–20)
CO2: 28 mmol/L (ref 22–32)
Calcium: 10 mg/dL (ref 8.9–10.3)
Chloride: 100 mmol/L (ref 98–111)
Creatinine, Ser: 0.78 mg/dL (ref 0.44–1.00)
GFR, Estimated: >60 mL/min (ref >60)
Glucose, Bld: 120 mg/dL — ABNORMAL HIGH (ref 70–99)
Potassium: 3.6 mmol/L (ref 3.5–5.1)
Sodium: 136 mmol/L (ref 135–145)

## 2021-03-11 LAB — URINALYSIS, ROUTINE W REFLEX MICROSCOPIC
Glucose, UA: NEGATIVE mg/dL
Ketones, ur: 15 mg/dL — AB
Leukocytes,Ua: NEGATIVE
Nitrite: NEGATIVE
Specific Gravity, Urine: 1.015 (ref 1.005–1.030)
pH: 6 (ref 5.0–8.0)

## 2021-03-11 LAB — URINALYSIS, MICROSCOPIC (REFLEX): Bacteria, UA: NONE SEEN

## 2021-03-11 LAB — RESP PANEL BY RT-PCR (FLU A&B, COVID) ARPGX2
Influenza A by PCR: NEGATIVE
Influenza B by PCR: NEGATIVE
SARS Coronavirus 2 by RT PCR: NEGATIVE

## 2021-03-11 LAB — TROPONIN I (HIGH SENSITIVITY): Troponin I (High Sensitivity): <2 ng/L (ref <18)

## 2021-03-11 LAB — CBC
HCT: 38.3 % (ref 36.0–46.0)
Hemoglobin: 12.6 g/dL (ref 12.0–15.0)
MCH: 32.2 pg (ref 26.0–34.0)
MCHC: 32.9 g/dL (ref 30.0–36.0)
MCV: 98 fL (ref 80.0–100.0)
Platelets: 293 10*3/uL (ref 150–400)
RBC: 3.91 MIL/uL (ref 3.87–5.11)
RDW: 12.9 % (ref 11.5–15.5)
WBC: 6.9 10*3/uL (ref 4.0–10.5)
nRBC: 0 % (ref 0.0–0.2)

## 2021-03-11 NOTE — ED Provider Notes (Signed)
[facility] Provider Note    Event Date/Time   First MD Initiated Contact with Patient 03/11/21 1208     (approximate)  History   Chief Complaint: Loss of Consciousness  HPI  Catherine Richardson is a 56 y.o. female with a past medical history of hypertension, hyperlipidemia, presents to the emergency department for syncopal episode.  According to the patient she was at work when she began feeling hot became nauseated and diaphoretic sat down in a chair and had a brief syncopal episode.  Patient states as a child she often had syncopal episodes that presented very similarly however throughout her adulthood she did not have these episodes until approximately 3 to 4 years ago when she began experiencing them once again.  She states her last episode was approximately 4 weeks ago.  Patient also states she has been coughing for the past several days and has an ill family member at home.  No fever.  Physical Exam   Triage Vital Signs: ED Triage Vitals  Enc Vitals Group     BP 03/11/21 1054 110/68     Pulse Rate 03/11/21 1054 65     Resp 03/11/21 1054 17     Temp 03/11/21 1054 98 F (36.7 C)     Temp Source 03/11/21 1054 Oral     SpO2 03/11/21 1054 99 %     Weight 03/11/21 1158 149 lb 0.5 oz (67.6 kg)     Height 03/11/21 1158 5\' 8"  (1.727 m)     Head Circumference --      Peak Flow --      Pain Score --      Pain Loc --      Pain Edu? --      Excl. in [place]? --     Most recent vital signs: Vitals:   03/11/21 1054  BP: 110/68  Pulse: 65  Resp: 17  Temp: 98 F (36.7 C)  SpO2: 99%    General: Awake, no distress.  CV:  Good peripheral perfusion.  Regular rate and rhythm  Resp:  Normal effort.  Equal breath sounds bilaterally.  Abd:  No distention.  Soft, nontender.  No rebound or guarding. Other:  Occasional cough during examination.   ED Results / Procedures / Treatments   EKG  EKG viewed and interpreted by myself shows a normal sinus rhythm at 69  bpm with a narrow QRS, normal axis, normal intervals, no concerning ST changes.  MEDICATIONS ORDERED IN ED: Medications - No data to display   IMPRESSION / MDM / [place] / ED COURSE  I reviewed the triage vital signs and the nursing notes.  Patient presents to the emergency department for syncopal episode while at work.  Patient states he got nauseated hot was able to sit down in a chair and then briefly passed out.  Currently the patient appears well, no distress.  No complaints states she feels back to normal.  Vitals are normal.  Patient's work-up is thus far reassuring.  Urinalysis shows a small amount of ketones possibly indicating mild dehydration.  No infection.  CBC is normal.  Chemistry is normal.  Given the patient's reassuring physical exam I believe the patient is safe for discharge home however I do believe the patient would benefit from cardiology follow-up for consideration of a Holter monitor.  Given the patient's cough over the past several days and syncopal episode today we will also test for COVID.  Basic lab work is  largely within normal limits but I have added on a troponin as a precaution to evaluate for ACS.  Patient denies any chest pain now or at any point.  Discussed the work-up so far and plan of care, patient agreeable.  Patient's work-up is reassuring.  Troponin is negative.  COVID/flu is negative.  Given the patient's syncopal episode I did consider admission however given the patient's reassuring work-up vitals and physical exam I believe the patient will be safe for discharge home with cardiology follow-up for consideration of a Holter monitor.  Patient agreeable to plan of care.  FINAL CLINICAL IMPRESSION(S) / ED DIAGNOSES   Syncope  Rx / DC Orders   Cardiology follow-up  Note:  This document was prepared using Dragon voice recognition software and may include unintentional dictation errors.   Harvest Dark, MD 03/11/21 [phone]

## 2021-03-11 NOTE — ED Triage Notes (Signed)
Pt comes into the ED via EMS from work, state she became hot and nauseous and went to the [place] and had syncope. Denies any pain or injury..  CBG180 140/74 100%RA 66HR

## 2021-03-11 NOTE — ED Notes (Signed)
UA SENT TO LAB

## 2021-03-27 HISTORY — PX: OTHER SURGICAL HISTORY: SHX169

## 2021-04-04 ENCOUNTER — Ambulatory Visit (INDEPENDENT_AMBULATORY_CARE_PROVIDER_SITE_OTHER): Payer: Medicaid Other | Admitting: Cardiology

## 2021-04-04 ENCOUNTER — Other Ambulatory Visit: Payer: Self-pay

## 2021-04-04 ENCOUNTER — Ambulatory Visit (INDEPENDENT_AMBULATORY_CARE_PROVIDER_SITE_OTHER): Payer: Medicaid Other

## 2021-04-04 ENCOUNTER — Encounter: Payer: Self-pay | Admitting: Emergency Medicine

## 2021-04-04 ENCOUNTER — Encounter: Payer: Self-pay | Admitting: Cardiology

## 2021-04-04 VITALS — BP 135/78 | HR 72 | Ht 68.0 in | Wt 155.0 lb

## 2021-04-04 DIAGNOSIS — R55 Syncope and collapse: Secondary | ICD-10-CM | POA: Diagnosis not present

## 2021-04-04 DIAGNOSIS — R0609 Other forms of dyspnea: Secondary | ICD-10-CM

## 2021-04-04 DIAGNOSIS — I951 Orthostatic hypotension: Secondary | ICD-10-CM | POA: Diagnosis not present

## 2021-04-04 NOTE — Progress Notes (Signed)
Primary Care Provider: [facility] [place] Cardiologist: None Electrophysiologist: None  Clinic Note: Chief Complaint  Patient presents with   ED F/U-syncope    Referred by Trilby Drummer after ER visit.    ===================================  ASSESSMENT/PLAN   Problem List Items Addressed This Visit       Cardiology Problems   Orthostatic hypotension - Primary (Chronic)    She has listed both hypertension and orthostatic hypotension.  She is on amlodipine which could make her susceptible to orthostatic hypotension.  Discussed importance of staying adequately hydrated and eating enough to go along with it.  At least 70 to 80 ounces of water a day with normal meals.  If the swelling gets worse, could also consider support stockings.      Relevant Orders   EKG 12-Lead     Other   Dyspnea    She really only notes the shortness of breath when she is feeling the strings episodes.  Checking 2D echocardiogram      Relevant Orders   LONG TERM MONITOR (3-14 DAYS)   ECHOCARDIOGRAM COMPLETE   Syncope and collapse    Very unusual symptoms.  They do sound like they are related to dehydration and almost a vasovagal event with sweating. Does not sound like there is any associated arrhythmia, but we will lease have her wear a 2-week monitor just to ensure there is no clue to any potential arrhythmias.  We will also check a 2D echo to exclude structural abnormality.  Encouraged adequate hydration.      Relevant Orders   EKG 12-Lead   LONG TERM MONITOR (3-14 DAYS)    ===================================  HPI:    Catherine Richardson is a 56 y.o. female with PMH notable for HTN, HLD and Previous Episodes of near Syncope As an Adult and Syncope As a Child who is being seen today for the evaluation of SYNCOPE at the request of [facility] [place]*.   Recent Hospitalizations:  March 14, 2020: Suspected COVID-19 infection ER  visit) March 11, 2021-[facility] ER: Chief complaint of brief syncopal episode.  Was at work, began feeling nauseated and diaphoretic, sat out of chair and passed out for short while.  Had had a similar episode 4 weeks prior to this (but did not pass out).  Noted more frequent coughing-family were sick. Vital signs are normal.  Work-up reassuring.  No signs of infection.  Labs suggestive of mild dehydration. Discharged home with recommendation for cardiology follow-up to discuss monitor.  Catherine Richardson was last seen on March 30, 2021 at [facility] by Dr. Rebeca Alert.  Denies chest pain dyspnea.  No TIA type symptoms.  Noted that the episode leading to the hospital visit the first time she actually passed out, she does not pass out.  Prior to this episode last 1. => Referral to cardiology   Reviewed  CV studies:    The following studies were reviewed today: (if available, images/films reviewed: From Epic Chart or Care Everywhere) TTE 12/06/2014: EF 60 to 65%.  Mild concentric LVH.  No RWMA.  Mild MR.  Normal RV size and function.  Normal valves. Treadmill Sestamibi January 25 1998: EF estimated 54%.  Normal wall motion and thickening.  Fixed defects involving anterior apical, anterior basal and septal basal walls.  Normal wall motion in these regions would suggest continuation artifact rather than prior myocardial scar (recommend different imaging modality for stress test evaluation in the future.)  Interval History:   Catherine Richardson presents for Cardiology evaluation as follow-up from her recent event.  She says that as a child during her teenage years she would have spells where she would start getting lightheaded nauseated with a sensation of feeling flushed and if she could sit down and cool himself off, have a fan blow on her drink cold water she would get feel better, but if she was not able to do that quick enough she may pass out.  Does went away in her early 39s, but  have just recently recurred with 2 or 3 episodes over the last couple years.  She has had some episodes where she was able to control it before she passed out but last May, she was outside at her daughter's graduation.  She had to walk a long ways to get to where the graduation was.  She felt very hot and flushed and then started feeling lightheaded and woozy next thing she knew she was waking up at the ambulance having passed out.  No further episodes until the episode noted on January 16.  This was unusual because she was at work at [employer] doing her routine shift which is not unusual for her, but she did started feeling nauseated flushed and sweaty all over almost clammy/diaphoretic.  She remembers getting up to go to the bathroom to splash cold water in her faceto cool down, but never made it to the bathroom.  Again she woke up having passed out.  Besides the prodrome of feeling hot (almost always starts off with being hot) and then feeling flushed with full body sweats she really does not notice that there has been any palpitations or any irregular heartbeats associated these episodes.  Certainly has not any chest pain or pressure associated with them.  There have been other times that have been much easier to control than these 2 particular episodes.  Otherwise, she is relatively active with no major other symptoms.  She does have some mild lower extremity edema near the end of the day but goes down at night.  No PND orthopnea.  No chest pain or pressure with rest or exertion.  No sensation of irregular heartbeats or palpitations.  No TIA or amaurosis fugax type symptoms.  She does think there is some association with these episodes and anxiety or stress.  That certainly accounts for the summertime episode but not somewhat the recent one at work.  It was not a more stressful day than usual at work, but may be so at home.  REVIEWED OF SYSTEMS   Pertinent symptoms noted above, otherwise review of  symptoms includes Review of Systems  Constitutional:  Negative for malaise/fatigue and weight loss.  HENT:  Negative for congestion.   Respiratory:  Negative for cough and shortness of breath.   Cardiovascular:        Per HPI  Gastrointestinal:  Negative for blood in stool and melena.  Genitourinary:  Negative for dysuria and hematuria.  Musculoskeletal:  Negative for joint pain.  Neurological:  Positive for dizziness, loss of consciousness and weakness (Only with her passout spells).       Per HPI  Psychiatric/Behavioral:  Negative for memory loss. The patient has insomnia (Just poor sleep.  Does not seem that she gets enough sleep).    I have reviewed and (if needed) personally updated the patient's problem list, medications, allergies, past medical and surgical history, social and family history.   PAST MEDICAL HISTORY  Past Medical History:  Diagnosis Date   High cholesterol    Recently started atorvastatin   Hypertension    Near syncope    Usually feels lightheaded, flushed and nauseated but does not pass out.  Has had at least 1 syncopal episode.  (Used to have episodes of syncope during childhood)    PAST SURGICAL HISTORY   Past Surgical History:  Procedure Laterality Date   CESAREAN SECTION     NM GATED MYOCARDIAL STUDY (ARMX HX)  01/25/1998   Treadmill Sestamibi [facility]): EF estimated 54%.  Normal wall motion and thickening.  Fixed defects involving anterior apical, anterior basal and septal basal walls.  Normal wall motion in these regions would suggest continuation artifact rather than prior myocardial scar (recommend different imaging modality for stress test evaluation in the future.)   TRANSTHORACIC ECHOCARDIOGRAM  11/2014   EF 60 to 65%.  Mild concentric LVH.  No RWMA.  Mild MR.  Normal RV size and function.  Normal valves.    There is no immunization history for the selected administration types on file for this patient.  MEDICATIONS/ALLERGIES   Current Meds   Medication Sig   amLODipine (NORVASC) 10 MG tablet Take 10 mg by mouth daily.   atorvastatin (LIPITOR) 40 MG tablet Take 40 mg by mouth daily.   OVER THE COUNTER MEDICATION Generic pain pill OTC one tablet by mouth daily    Allergies  Allergen Reactions   Hydrochlorothiazide     SOCIAL HISTORY/FAMILY HISTORY   Reviewed in Epic:   Social History   Tobacco Use   Smoking status: Never   Smokeless tobacco: Never  Vaping Use   Vaping Use: Never used  Substance Use Topics   Alcohol use: Yes    Alcohol/week: 10.0 standard drinks    Types: 3 Shots of liquor, 7 Cans of beer per week   Drug use: No   Social History   Social History Narrative   Lives with her daughter.  Works part-time-2 hours a day   5 drinks of EtOH 4 days a week; no illicit drugs.     Family History  Problem Relation Age of Onset   CAD Mother    Heart attack Mother 66   Heart failure Mother    CVA Sister    Cerebral aneurysm Sister    Deep vein thrombosis Brother    Heart attack Brother 80   CAD Brother 79   Mother died of MI at age 104, brother MI at 14.  Sister had 2 cerebral aneurysm and CVA starting at age 62. Aunts and uncles have diabetes. Father 's side have issues with blood clots.   OBJCTIVE -PE, EKG, labs   Wt Readings from Last 3 Encounters:  04/04/21 155 lb (70.3 kg)  03/11/21 149 lb 0.5 oz (67.6 kg)  03/14/20 148 lb 5 oz (67.3 kg)    Physical Exam: BP 135/78 (BP Location: Right Arm, Patient Position: Sitting, Cuff Size: Normal)    Pulse 72    Ht 5\' 8"  (1.727 m)    Wt 155 lb (70.3 kg)    SpO2 98%    BMI 23.57 kg/m  Physical Exam Vitals reviewed.  Constitutional:      General: She is not in acute distress.    Appearance: Normal appearance. She is normal weight.  HENT:     Head: Normocephalic and atraumatic.  Eyes:     Extraocular Movements: Extraocular movements intact.     Pupils: Pupils are equal, round, and reactive to  light.  Neck:     Vascular: No carotid bruit or JVD.   Cardiovascular:     Rate and Rhythm: Normal rate and regular rhythm. No extrasystoles are present.    Chest Wall: PMI is not displaced.     Pulses: Normal pulses and intact distal pulses.     Heart sounds: Normal heart sounds, S1 normal and S2 normal. No murmur heard.   No friction rub. No gallop.  Pulmonary:     Effort: No respiratory distress.     Breath sounds: Normal breath sounds.  Chest:     Chest wall: No tenderness.  Abdominal:     General: Abdomen is flat. Bowel sounds are normal. There is no distension.     Palpations: Abdomen is soft. There is no mass (No HSM or bruit).     Tenderness: There is no abdominal tenderness.  Musculoskeletal:        General: No swelling.     Cervical back: Normal range of motion and neck supple.  Skin:    General: Skin is warm and dry.  Neurological:     General: No focal deficit present.     Mental Status: She is alert and oriented to person, place, and time.  Psychiatric:        Mood and Affect: Mood normal.        Behavior: Behavior normal.        Thought Content: Thought content normal.        Judgment: Judgment normal.     Adult ECG Report  Rate: 90 ;  Rhythm: normal sinus rhythm and normal axis, intervals and durations. ;   Narrative Interpretation: Mostly normal EKG.  Recent Labs:  03/30/2021 Na+ 141, K+ 4.1, Cl- 100, HCO3-27, BUN 14, Cr 0.6, Glu 96, Ca2+ 9.8;  TC 289, TG 101, HDL 87, LDL c 195 => started on atorvastatin 20 mg daily   No results found for: CHOL, HDL, LDLCALC, LDLDIRECT, TRIG, CHOLHDL Lab Results  Component Value Date   CREATININE 0.78 03/11/2021   BUN 23 (H) 03/11/2021   NA 136 03/11/2021   K 3.6 03/11/2021   CL 100 03/11/2021   CO2 28 03/11/2021   CBC Latest Ref Rng & Units 03/11/2021 07/29/2019 06/28/2019  WBC 4.0 - 10.5 K/uL 6.9 5.8 5.1  Hemoglobin 12.0 - 15.0 g/dL 12.6 12.1 13.6  Hematocrit 36.0 - 46.0 % 38.3 36.1 40.5  Platelets 150 - 400 K/uL 293 269 282    No results found for: HGBA1C Lab Results   Component Value Date   TSH 0.646 08/30/2015    ==================================================  COVID-19 Education: The signs and symptoms of COVID-19 were discussed with the patient and how to seek care for testing (follow up with PCP or arrange E-visit).    I spent a total of 18 minutes with the patient spent in direct patient consultation.  Additional time spent with chart review  / charting (studies, outside notes, etc): 18 minutes for precharting, 19 minutes for note completion-37 minutes total charting. min Total Time: 55 min  Current medicines are reviewed at length with the patient today.  (+/- concerns) none  This visit occurred during the SARS-CoV-2 public health emergency.  Safety protocols were in place, including screening questions prior to the visit, additional usage of staff PPE, and extensive cleaning of exam room while observing appropriate contact time as indicated for disinfecting solutions.  Notice: This dictation was prepared with Dragon dictation along with smart phrase technology. Any transcriptional errors that result  from this process are unintentional and may not be corrected upon review.   Studies Ordered:  Orders Placed This Encounter  Procedures   LONG TERM MONITOR (3-14 DAYS)   EKG 12-Lead   ECHOCARDIOGRAM COMPLETE    Patient Instructions / Medication Changes & Studies & Tests Ordered   Patient Instructions  Medication Instructions:   Your physician recommends that you continue on your current medications as directed. Please refer to the Current Medication list given to you today.   *If you need a refill on your cardiac medications before your next appointment, please call your pharmacy*   Lab Work:  None ordered  If you have labs (blood work) drawn today and your tests are completely normal, you will receive your results only by: [place] (if you have MyChart) OR A paper copy in the mail If you have any lab test that is abnormal  or we need to change your treatment, we will call you to review the results.   Testing/Procedures:  Echocardiogram - Your physician has requested that you have an echocardiogram in 4 weeks. Echocardiography is a painless test that uses sound waves to create images of your heart. It provides your doctor with information about the size and shape of your heart and how well your hearts chambers and valves are working. This procedure takes approximately one hour. There are no restrictions for this procedure.   2. Your provider has ordered a heart monitor to wear for 14 days. This will be mailed to your home with instructions on placement. Once you have finished the time frame requested, you will return monitor in box provided.     Follow-Up: At [facility], you and your health needs are our priority.  As part of our continuing mission to provide you with exceptional heart care, we have created designated Provider Care Teams.  These Care Teams include your primary Cardiologist (physician) and Advanced Practice Providers (APPs -  Physician Assistants and Nurse Practitioners) who all work together to provide you with the care you need, when you need it.  We recommend signing up for the patient portal called "MyChart".  Sign up information is provided on this After Visit Summary.  MyChart is used to connect with patients for Virtual Visits (Telemedicine).  Patients are able to view lab/test results, encounter notes, upcoming appointments, etc.  Non-urgent messages can be sent to your provider as well.   To learn more about what you can do with MyChart, go to [email or web address].    Your next appointment:   5-6 week(s)  The format for your next appointment:   In Person  Provider:   You may see Glenetta Hew, MD or one of the following Advanced Practice Providers on your designated Care Team:   Murray Hodgkins, NP Christell Faith, PA-C Cadence Kathlen Mody, PA-C:1}    Other  Instructions N/A    Glenetta Hew, M.D., M.S. Interventional Cardiologist   Pager # [phone] Phone # [phone] [address] [place], [place] [address]   Thank you for choosing [facility] [place]!!

## 2021-04-04 NOTE — Assessment & Plan Note (Signed)
Very unusual symptoms.  They do sound like they are related to dehydration and almost a vasovagal event with sweating. Does not sound like there is any associated arrhythmia, but we will lease have her wear a 2-week monitor just to ensure there is no clue to any potential arrhythmias.  We will also check a 2D echo to exclude structural abnormality.  Encouraged adequate hydration.

## 2021-04-04 NOTE — Assessment & Plan Note (Signed)
She has listed both hypertension and orthostatic hypotension.  She is on amlodipine which could make her susceptible to orthostatic hypotension.  Discussed importance of staying adequately hydrated and eating enough to go along with it.  At least 70 to 80 ounces of water a day with normal meals.  If the swelling gets worse, could also consider support stockings.

## 2021-04-04 NOTE — Assessment & Plan Note (Signed)
She really only notes the shortness of breath when she is feeling the strings episodes.  Checking 2D echocardiogram

## 2021-04-04 NOTE — Patient Instructions (Signed)
Medication Instructions:   Your physician recommends that you continue on your current medications as directed. Please refer to the Current Medication list given to you today.   *If you need a refill on your cardiac medications before your next appointment, please call your pharmacy*   Lab Work:  None ordered  If you have labs (blood work) drawn today and your tests are completely normal, you will receive your results only by: [place] (if you have MyChart) OR A paper copy in the mail If you have any lab test that is abnormal or we need to change your treatment, we will call you to review the results.   Testing/Procedures:  Echocardiogram - Your physician has requested that you have an echocardiogram in 4 weeks. Echocardiography is a painless test that uses sound waves to create images of your heart. It provides your doctor with information about the size and shape of your heart and how well your hearts chambers and valves are working. This procedure takes approximately one hour. There are no restrictions for this procedure.   2. Your provider has ordered a heart monitor to wear for 14 days. This will be mailed to your home with instructions on placement. Once you have finished the time frame requested, you will return monitor in box provided.      Follow-Up: At [facility], you and your health needs are our priority.  As part of our continuing mission to provide you with exceptional heart care, we have created designated Provider Care Teams.  These Care Teams include your primary Cardiologist (physician) and Advanced Practice Providers (APPs -  Physician Assistants and Nurse Practitioners) who all work together to provide you with the care you need, when you need it.  We recommend signing up for the patient portal called "MyChart".  Sign up information is provided on this After Visit Summary.  MyChart is used to connect with patients for Virtual Visits (Telemedicine).   Patients are able to view lab/test results, encounter notes, upcoming appointments, etc.  Non-urgent messages can be sent to your provider as well.   To learn more about what you can do with MyChart, go to [email or web address].    Your next appointment:   5-6 week(s)  The format for your next appointment:   In Person  Provider:   You may see Glenetta Hew, MD or one of the following Advanced Practice Providers on your designated Care Team:   Murray Hodgkins, NP Christell Faith, PA-C Cadence Kathlen Mody, PA-C:1}    Other Instructions N/A

## 2021-04-04 NOTE — Progress Notes (Signed)
Work note

## 2021-04-08 DIAGNOSIS — R55 Syncope and collapse: Secondary | ICD-10-CM

## 2021-04-08 DIAGNOSIS — R0609 Other forms of dyspnea: Secondary | ICD-10-CM

## 2021-05-02 ENCOUNTER — Other Ambulatory Visit: Payer: Self-pay

## 2021-05-02 ENCOUNTER — Ambulatory Visit (INDEPENDENT_AMBULATORY_CARE_PROVIDER_SITE_OTHER): Payer: Medicaid Other

## 2021-05-02 DIAGNOSIS — R0609 Other forms of dyspnea: Secondary | ICD-10-CM

## 2021-05-02 HISTORY — PX: TRANSTHORACIC ECHOCARDIOGRAM: SHX275

## 2021-05-02 LAB — ECHOCARDIOGRAM COMPLETE
AR max vel: 3.06 cm2
AV Area VTI: 2.75 cm2
AV Area mean vel: 2.89 cm2
AV Mean grad: 3 mmHg
AV Peak grad: 5.9 mmHg
Ao pk vel: 1.21 m/s
Area-P 1/2: 3.54 cm2
Calc EF: 62.4 %
S' Lateral: 2.9 cm
Single Plane A2C EF: 60.4 %
Single Plane A4C EF: 64.2 %

## 2021-05-09 ENCOUNTER — Other Ambulatory Visit: Payer: Self-pay

## 2021-05-09 ENCOUNTER — Encounter: Payer: Self-pay | Admitting: Cardiology

## 2021-05-09 ENCOUNTER — Ambulatory Visit (INDEPENDENT_AMBULATORY_CARE_PROVIDER_SITE_OTHER): Payer: Medicaid Other | Admitting: Cardiology

## 2021-05-09 VITALS — BP 130/70 | HR 78 | Ht 69.0 in | Wt 157.0 lb

## 2021-05-09 DIAGNOSIS — I10 Essential (primary) hypertension: Secondary | ICD-10-CM | POA: Diagnosis not present

## 2021-05-09 DIAGNOSIS — R0609 Other forms of dyspnea: Secondary | ICD-10-CM | POA: Diagnosis not present

## 2021-05-09 DIAGNOSIS — R55 Syncope and collapse: Secondary | ICD-10-CM

## 2021-05-09 DIAGNOSIS — I951 Orthostatic hypotension: Secondary | ICD-10-CM

## 2021-05-09 NOTE — Progress Notes (Addendum)
? ?Primary Care Provider: [facility] [place] ?Cardiologist: None ?Electrophysiologist: None ? ?Clinic Note: ?Chief Complaint  ?Patient presents with  ? Follow-up  ?  Follow up post testing. Meds reviewed verbally with patient.   ? Loss of Consciousness  ?  No further episodes  ? ?=================================== ? ?ASSESSMENT/PLAN  ? ?Problem List Items Addressed This Visit   ? ?  ? Cardiology Problems  ? Essential hypertension (Chronic)  ?  Blood pressure looks pretty well controlled today 130/70 on 10 mg of Norvasc. ? ?Concerning thing is had issues with orthostatic hypotension and potentially vasovagal syncope.  Would not want to be overly aggressive with blood pressure management. ?While treating hypertension, also need to make sure that she stays adequately hydrated.  Could consider switching amlodipine to 5 mg twice daily. ? ?  ?  ? Orthostatic hypotension (Chronic)  ?  History of orthostatic hypotension but also hypertension. ?She takes 10 mg of amlodipine and blood pressure looks pretty good here today.  Could consider possibly splitting up to 1/2 tablet twice daily. ? ?Stressed importance of adequate hydration at least 70 to 80 ounces of water a day with normal meals. ? ?  ?  ?  ? Other  ? Dyspnea  ?  Relatively reassuring echocardiogram. ? ?  ?  ? Syncope and collapse - Primary  ?  The episode she had at work does sound like she is may have been somewhat overheated with dehydration and a vasovagal episode.  Not likely to be arrhythmogenic, especially with nothing seen on monitor. ?An episode that would have been related to bradycardia or really fast tachycardia. ?Was seen on monitor.  Echo was also normal with no structural abnormalities. ? ?Did discuss concerns about state law indicating no driving within 6 months syncopal episode.  Unfortunately, no clear diagnosis has been determined. ? ?  ?  ? ?=================================== ? ?HPI:   ? ?Catherine Richardson is a 56 y.o. female  with a PMH notable for HTN, HLD and prior history of Syncope/near Syncope who presents today for 4 to 6-week follow-up evaluation for. ? ?Catherine Richardson was last seen on April 04, 2021 at the request of Dr. Rebeca Alert at [place], [place]*.  This was in response to an ER visit on January 16 following a brief syncopal episode that occurred while at work.  Began with feeling nauseated and diaphoretic.  She sat on the chair and passed out for short while (she mentions that she tried to get up to go to the bathroom to splash water on her face, but never made it to the bathroom.  Had had an episode 4 weeks prior to that but did not fully pass out.  Noted frequent coughing. ?-> Previous cardiac evaluation with tReadmill Sestamibi back in 1999-nonischemic, Echo in 2016 normal. ?Echocardiogram and Zio Patch Monitor ordered ? ?Recent Hospitalizations: None since January ? ?Reviewed  CV studies:   ? ?The following studies were reviewed today: (if available, images/films reviewed: From Epic Chart or Care Everywhere) ?TTE 05/02/21: EF 60 to 65%.  Normal LV function.  No RWMA.  Normal strain.  Normal diastolic pressures.  Normal RV.  Normal valves.  Normal CVP.  ? ?Zio Patch Monitor ?? Predominant rhythm-Sinus Rhythm: Heart rate range 64-153 bpm, average 90 bpm. ?? Rare PACs and PVCs. Rare couplets and triplets noted. ?? 9 atrial runs: Fastest-6 beats, 190 bpm, longest-15 beats average 121 bpm. (Not noted patient trigger diary) ?? No sustained arrhythmias: Atrial tachycardia,  atrial fibrillation, atrial flutter, supraventricular tachycardia, ventricular tachycardia. ?? No bradycardia or pauses. ?Overall pretty normal findings.  Short little atrial runs are not symptomatic on monitor (and would not cause syncope).  Relatively benign.  No abnormal arrhythmias. ? ?Interval History:  ? ?Catherine Richardson presents here today for evaluation feeling okay.  She has not had any further syncopal episodes.  She had 1 episode where she  felt a strange palpitation sensation in her chest that lasted maybe 5 minutes.  Otherwise she has not had any significant rapid irregular heartbeats.  No PND, orthopnea, or edema. ?No chest pressure with rest exertion. ? ?She has been trying to do good job with staying adequately hydrated. ? ?REVIEWED OF SYSTEMS  ? ?Review of Systems  ?Constitutional:  Negative for malaise/fatigue and weight loss.  ?Respiratory:  Negative for cough and shortness of breath.   ?Gastrointestinal:  Negative for blood in stool and melena.  ?Genitourinary:  Negative for hematuria.  ?Musculoskeletal:  Negative for back pain, joint pain and myalgias.  ?Neurological:  Positive for dizziness (Mild). Negative for loss of consciousness (No further syncope).  ?Psychiatric/Behavioral:  Negative for depression and memory loss. The patient is nervous/anxious and has insomnia (Does not feel like she gets a full night of sleep).  . ? ?I have reviewed and (if needed) personally updated the patient's problem list, medications, allergies, past medical and surgical history, social and family history.  ? ?PAST MEDICAL HISTORY  ? ?Past Medical History:  ?Diagnosis Date  ? High cholesterol   ? Recently started atorvastatin  ? Hypertension   ? Near syncope   ? Usually feels lightheaded, flushed and nauseated but does not pass out.  Has had at least 1 syncopal episode.  (Used to have episodes of syncope during childhood)  ? ? ?PAST SURGICAL HISTORY  ? ?Past Surgical History:  ?Procedure Laterality Date  ? CESAREAN SECTION    ? [place] GATED MYOCARDIAL STUDY (ARMX HX)  01/25/1998  ? Treadmill Sestamibi [facility]): EF estimated 54%.  Normal wall motion and thickening.  Fixed defects involving anterior apical, anterior basal and septal basal walls.  Normal wall motion in these regions would suggest continuation artifact rather than prior myocardial scar (recommend different imaging modality for stress test evaluation in the future.)  ? TRANSTHORACIC ECHOCARDIOGRAM   05/02/2021  ? EF 60 to 65%.  Normal LV function.  No RWMA.  Normal strain.  Normal diastolic pressures.  Normal RV.  Normal valves.  Normal CVP.=> No change from 2016  ? Zio Patch Monitor  03/2021  ? Predominantly sinus rhythm: Range: 64-153 bpm, avg 90 bpm; rare PACs and PVCs.  9 atrial runs (fastest 6 beats 190 bpm, longest 15 beats 121 bpm) -> not noted on trigger.  No bradycardia or pauses.  No sustained arrhythmias.  ? ? ?There is no immunization history for the selected administration types on file for this patient. ? ?MEDICATIONS/ALLERGIES  ? ?Current Meds  ?Medication Sig  ? amLODipine (NORVASC) 10 MG tablet Take 10 mg by mouth daily.  ? atorvastatin (LIPITOR) 40 MG tablet Take 40 mg by mouth daily.  ? OVER THE COUNTER MEDICATION Generic pain pill OTC one tablet by mouth daily  ? ? ?Allergies  ?Allergen Reactions  ? Hydrochlorothiazide   ? ? ?SOCIAL HISTORY/FAMILY HISTORY  ? ?Reviewed in Epic:  ?Pertinent findings:  ?Social History  ? ?Tobacco Use  ? Smoking status: Never  ? Smokeless tobacco: Never  ?Vaping Use  ? Vaping Use: Never used  ?  Substance Use Topics  ? Alcohol use: Yes  ?  Alcohol/week: 10.0 standard drinks  ?  Types: 3 Shots of liquor, 7 Cans of beer per week  ? Drug use: No  ? ?Social History  ? ?Social History Narrative  ? Lives with her daughter.  Works part-time-2 hours a day  ? 5 drinks of EtOH 4 days a week; no illicit drugs.  ? ? ?OBJCTIVE -PE, EKG, labs  ? ?Wt Readings from Last 3 Encounters:  ?05/09/21 157 lb (71.2 kg)  ?04/04/21 155 lb (70.3 kg)  ?03/11/21 149 lb 0.5 oz (67.6 kg)  ? ? ?Physical Exam: ?BP 130/70 (BP Location: Left Arm, Patient Position: Sitting, Cuff Size: Normal)   Pulse 78   Ht '5\' 9"'$  (1.753 m)   Wt 157 lb (71.2 kg)   SpO2 99%   BMI 23.18 kg/m?  ?Physical Exam ?Vitals reviewed.  ?Constitutional:   ?   General: She is not in acute distress. ?   Appearance: Normal appearance. She is normal weight. She is not ill-appearing or toxic-appearing.  ?Neck:  ?   Vascular: No  carotid bruit or JVD.  ?Cardiovascular:  ?   Rate and Rhythm: Normal rate and regular rhythm. No extrasystoles are present. ?   Chest Wall: PMI is not displaced.  ?   Pulses: Normal pulses and intact distal puls

## 2021-05-09 NOTE — Patient Instructions (Signed)
Medication Instructions:  ? ?Your physician recommends that you continue on your current medications as directed. Please refer to the Current Medication list given to you today.  ? ?*If you need a refill on your cardiac medications before your next appointment, please call your pharmacy* ? ? ?Lab Work: ?None ordered ? ?If you have labs (blood work) drawn today and your tests are completely normal, you will receive your results only by: ?MyChart Message (if you have MyChart) OR ?A paper copy in the mail ?If you have any lab test that is abnormal or we need to change your treatment, we will call you to review the results. ? ? ?Testing/Procedures: ?None ordered ? ? ?Follow-Up: ?At [facility], you and your health needs are our priority.  As part of our continuing mission to provide you with exceptional heart care, we have created designated Provider Care Teams.  These Care Teams include your primary Cardiologist (physician) and Advanced Practice Providers (APPs -  Physician Assistants and Nurse Practitioners) who all work together to provide you with the care you need, when you need it. ? ?We recommend signing up for the patient portal called "MyChart".  Sign up information is provided on this After Visit Summary.  MyChart is used to connect with patients for Virtual Visits (Telemedicine).  Patients are able to view lab/test results, encounter notes, upcoming appointments, etc.  Non-urgent messages can be sent to your provider as well.   ?To learn more about what you can do with MyChart, go to [email or web address].   ? ?Your next appointment:   ?As needed ? ?The format for your next appointment:   ?In Person ? ?Provider:   ?You may see Glenetta Hew, MD or one of the following Advanced Practice Providers on your designated Care Team:   ?Murray Hodgkins, NP ?Christell Faith, PA-C ?Cadence Kathlen Mody, PA-C ? ? ?Other Instructions ?N/A ?

## 2021-06-10 ENCOUNTER — Encounter: Payer: Self-pay | Admitting: Cardiology

## 2021-06-10 NOTE — Assessment & Plan Note (Signed)
History of orthostatic hypotension but also hypertension. ?She takes 10 mg of amlodipine and blood pressure looks pretty good here today.  Could consider possibly splitting up to 1/2 tablet twice daily. ? ?Stressed importance of adequate hydration at least 70 to 80 ounces of water a day with normal meals. ?

## 2021-06-10 NOTE — Assessment & Plan Note (Signed)
The episode she had at work does sound like she is may have been somewhat overheated with dehydration and a vasovagal episode.  Not likely to be arrhythmogenic, especially with nothing seen on monitor. ?An episode that would have been related to bradycardia or really fast tachycardia. ?Was seen on monitor.  Echo was also normal with no structural abnormalities. ? ?Did discuss concerns about state law indicating no driving within 6 months syncopal episode.  Unfortunately, no clear diagnosis has been determined. ?

## 2021-06-10 NOTE — Assessment & Plan Note (Signed)
Relatively reassuring echocardiogram. ?

## 2021-06-10 NOTE — Assessment & Plan Note (Signed)
Blood pressure looks pretty well controlled today 130/70 on 10 mg of Norvasc. ? ?Concerning thing is had issues with orthostatic hypotension and potentially vasovagal syncope.  Would not want to be overly aggressive with blood pressure management. ?While treating hypertension, also need to make sure that she stays adequately hydrated.  Could consider switching amlodipine to 5 mg twice daily. ?

## 2022-02-10 ENCOUNTER — Emergency Department
Admission: EM | Admit: 2022-02-10 | Discharge: 2022-02-10 | Disposition: A | Discharge status: Home / Self Care | Payer: Medicaid Other | Attending: Emergency Medicine | Admitting: Emergency Medicine

## 2022-02-10 ENCOUNTER — Emergency Department: Payer: Medicaid Other

## 2022-02-10 ENCOUNTER — Other Ambulatory Visit: Payer: Self-pay

## 2022-02-10 ENCOUNTER — Encounter: Payer: Self-pay | Admitting: Emergency Medicine

## 2022-02-10 DIAGNOSIS — I10 Essential (primary) hypertension: Secondary | ICD-10-CM | POA: Insufficient documentation

## 2022-02-10 DIAGNOSIS — R0789 Other chest pain: Secondary | ICD-10-CM | POA: Diagnosis present

## 2022-02-10 LAB — COMPREHENSIVE METABOLIC PANEL
ALT: 17 U/L (ref 0–44)
AST: 18 U/L (ref 15–41)
Albumin: 3.8 g/dL (ref 3.5–5.0)
Alkaline Phosphatase: 119 U/L (ref 38–126)
Anion gap: 7 (ref 5–15)
BUN: 15 mg/dL (ref 6–20)
CO2: 26 mmol/L (ref 22–32)
Calcium: 9.5 mg/dL (ref 8.9–10.3)
Chloride: 106 mmol/L (ref 98–111)
Creatinine, Ser: 0.45 mg/dL (ref 0.44–1.00)
GFR, Estimated: >60 mL/min (ref >60)
Glucose, Bld: 125 mg/dL — ABNORMAL HIGH (ref 70–99)
Potassium: 3.4 mmol/L — ABNORMAL LOW (ref 3.5–5.1)
Sodium: 139 mmol/L (ref 135–145)
Total Bilirubin: 0.9 mg/dL (ref 0.3–1.2)
Total Protein: 7.7 g/dL (ref 6.5–8.1)

## 2022-02-10 LAB — CBC
HCT: 37.6 % (ref 36.0–46.0)
Hemoglobin: 12 g/dL (ref 12.0–15.0)
MCH: 31.2 pg (ref 26.0–34.0)
MCHC: 31.9 g/dL (ref 30.0–36.0)
MCV: 97.7 fL (ref 80.0–100.0)
Platelets: 275 10*3/uL (ref 150–400)
RBC: 3.85 MIL/uL — ABNORMAL LOW (ref 3.87–5.11)
RDW: 12.4 % (ref 11.5–15.5)
WBC: 7.4 10*3/uL (ref 4.0–10.5)
nRBC: 0 % (ref 0.0–0.2)

## 2022-02-10 LAB — LIPASE, BLOOD: Lipase: 42 U/L (ref 11–51)

## 2022-02-10 LAB — TROPONIN I (HIGH SENSITIVITY): Troponin I (High Sensitivity): 3 ng/L (ref <18)

## 2022-02-10 MED ORDER — FAMOTIDINE 20 MG PO TABS: 20.0000 mg | ORAL_TABLET | Freq: Two times a day (BID) | ORAL | 0 refills | Status: AC

## 2022-02-10 MED ORDER — ALUM & MAG HYDROXIDE-SIMETH 200-200-20 MG/5ML PO SUSP
30.0000 mL | Freq: Once | ORAL | Status: DC
  Filled 2022-02-10: qty 30

## 2022-02-10 MED ORDER — FAMOTIDINE 20 MG PO TABS
40.0000 mg | ORAL_TABLET | Freq: Once | ORAL | Status: DC
  Filled 2022-02-10: qty 2

## 2022-02-10 MED ORDER — ALUMINUM-MAGNESIUM-SIMETHICONE 200-200-20 MG/5ML PO SUSP: 30.0000 mL | Freq: Three times a day (TID) | ORAL | 0 refills | Status: AC

## 2022-02-10 NOTE — ED Provider Notes (Signed)
[facility] Provider Note    Event Date/Time   First MD Initiated Contact with Patient 02/10/22 [phone]     (approximate)   History   Chief Complaint: Chest Pain   HPI  MELIDA NORTHINGTON is a 56 y.o. female with a history of hypertension who comes the ED complaining of upper abdominal pain that started at 10:00 PM yesterday.  Intermittent, shooting and sharp the last for a second or 2 at a time, worse overnight while lying down.  No nausea or vomiting, no shortness of breath diaphoresis.  Not exertional, not pleuritic.  No aggravating or alleviating factors.     Physical Exam   Triage Vital Signs: ED Triage Vitals [02/10/22 0619]  Enc Vitals Group     BP (!) 143/79     Pulse Rate 82     Resp 19     Temp 98.6 F (37 C)     Temp Source Oral     SpO2 96 %     Weight 165 lb (74.8 kg)     Height '5\' 8"'$  (1.727 m)     Head Circumference      Peak Flow      Pain Score 8     Pain Loc      Pain Edu?      Excl. in [place]?     Most recent vital signs: Vitals:   02/10/22 0619  BP: (!) 143/79  Pulse: 82  Resp: 19  Temp: 98.6 F (37 C)  SpO2: 96%    General: Awake, no distress.  CV:  Good peripheral perfusion.  Regular rate rhythm Resp:  Normal effort.  Clear to auscultation bilaterally Abd:  No distention.  Soft nontender Other:  No lower extremity edema.   ED Results / Procedures / Treatments   Labs (all labs ordered are listed, but only abnormal results are displayed) Labs Reviewed  COMPREHENSIVE METABOLIC PANEL - Abnormal; Notable for the following components:      Result Value   Potassium 3.4 (*)    Glucose, Bld 125 (*)    All other components within normal limits  CBC - Abnormal; Notable for the following components:   RBC 3.85 (*)    All other components within normal limits  LIPASE, BLOOD  URINALYSIS, ROUTINE W REFLEX MICROSCOPIC  TROPONIN I (HIGH SENSITIVITY)     EKG Interpreted by me Normal sinus rhythm rate of 77.  Normal  axis and intervals.  Poor R wave progression.  Normal ST segments and T waves.   RADIOLOGY Chest x-ray interpreted by me, appears normal.  Radiology report reviewed   PROCEDURES:  Procedures   MEDICATIONS ORDERED IN ED: Medications  alum & mag hydroxide-simeth (MAALOX/MYLANTA) 200-200-20 MG/5ML suspension 30 mL (has no administration in time range)  famotidine (PEPCID) tablet 40 mg (has no administration in time range)     IMPRESSION / MDM / ASSESSMENT AND PLAN / ED COURSE  I reviewed the triage vital signs and the nursing notes.                              Differential diagnosis includes, but is not limited to, GERD, gastritis, gas pain, musculoskeletal pain.  Patient's presentation is most consistent with acute presentation with potential threat to life or bodily function.  Patient presents with atypical upper abdomen/chest discomfort.  Most likely GERD. Considering the patient's symptoms, medical history, and physical examination today, I have low  suspicion for cholecystitis or biliary pathology, pancreatitis, perforation or bowel obstruction, hernia, intra-abdominal abscess, AAA or dissection, volvulus or intussusception, mesenteric ischemia, or appendicitis. Doubt ACS PE dissection pericardial effusion pneumothorax.  Exam and workup are benign.  Cardiac workup not indicated.  Stable for discharge, trial of antacid.       FINAL CLINICAL IMPRESSION(S) / ED DIAGNOSES   Final diagnoses:  Atypical chest pain     Rx / DC Orders   ED Discharge Orders          Ordered    aluminum-magnesium hydroxide-simethicone (MAALOX) 200-200-20 MG/5ML SUSP  3 times daily before meals & bedtime        02/10/22 0800    famotidine (PEPCID) 20 MG tablet  2 times daily        02/10/22 0800             Note:  This document was prepared using Dragon voice recognition software and may include unintentional dictation errors.   Carrie Mew, MD 02/10/22 [phone]

## 2022-02-10 NOTE — ED Triage Notes (Signed)
Pt to ED via POV, c/o bilateral upper abd/ lower chest pain that started yesterday. Pt states pain is intermittent and sharp shooting in nature. Pt A&O x4, NAD in triage.

## 2022-07-26 ENCOUNTER — Emergency Department: Payer: Medicaid Other

## 2022-07-26 ENCOUNTER — Emergency Department
Admission: EM | Admit: 2022-07-26 | Discharge: 2022-07-26 | Disposition: A | Discharge status: Home / Self Care | Payer: Medicaid Other | Attending: Emergency Medicine | Admitting: Emergency Medicine

## 2022-07-26 ENCOUNTER — Encounter: Payer: Self-pay | Admitting: Emergency Medicine

## 2022-07-26 ENCOUNTER — Other Ambulatory Visit: Payer: Self-pay

## 2022-07-26 DIAGNOSIS — I10 Essential (primary) hypertension: Secondary | ICD-10-CM | POA: Insufficient documentation

## 2022-07-26 DIAGNOSIS — R079 Chest pain, unspecified: Secondary | ICD-10-CM | POA: Insufficient documentation

## 2022-07-26 LAB — BASIC METABOLIC PANEL
Anion gap: 11 (ref 5–15)
BUN: 13 mg/dL (ref 6–20)
CO2: 22 mmol/L (ref 22–32)
Calcium: 9.1 mg/dL (ref 8.9–10.3)
Chloride: 103 mmol/L (ref 98–111)
Creatinine, Ser: 0.53 mg/dL (ref 0.44–1.00)
GFR, Estimated: >60 mL/min (ref >60)
Glucose, Bld: 87 mg/dL (ref 70–99)
Potassium: 3.5 mmol/L (ref 3.5–5.1)
Sodium: 136 mmol/L (ref 135–145)

## 2022-07-26 LAB — CBC
HCT: 38.8 % (ref 36.0–46.0)
Hemoglobin: 12.3 g/dL (ref 12.0–15.0)
MCH: 31.4 pg (ref 26.0–34.0)
MCHC: 31.7 g/dL (ref 30.0–36.0)
MCV: 99 fL (ref 80.0–100.0)
Platelets: 300 10*3/uL (ref 150–400)
RBC: 3.92 MIL/uL (ref 3.87–5.11)
RDW: 12.4 % (ref 11.5–15.5)
WBC: 5.5 10*3/uL (ref 4.0–10.5)
nRBC: 0 % (ref 0.0–0.2)

## 2022-07-26 LAB — TROPONIN I (HIGH SENSITIVITY): Troponin I (High Sensitivity): 3 ng/L (ref <18)

## 2022-07-26 NOTE — ED Provider Notes (Signed)
[facility] Provider Note    Event Date/Time   First MD Initiated Contact with Patient 07/26/22 [phone]     (approximate)   History   Chest Pain   HPI  Catherine Richardson is a 57 y.o. female with a history of hypertension who presents with complaints of left lower chest discomfort that she woke up this morning.  She reports some mild shortness of breath as well.  History of substance abuse but denies recent usage.  Yesterday she noticed some swelling in her feet bilaterally.  No fevers or chills.  No cough.  She reports she was moving some objects yesterday and thinks she may have pulled a muscle.  Pain is worse when she moves     Physical Exam   Triage Vital Signs: ED Triage Vitals  Enc Vitals Group     BP 07/26/22 0844 135/66     Pulse Rate 07/26/22 0844 88     Resp 07/26/22 0844 18     Temp 07/26/22 0844 98.7 F (37.1 C)     Temp Source 07/26/22 0844 Oral     SpO2 07/26/22 0844 95 %     Weight 07/26/22 0842 71.2 kg (157 lb)     Height 07/26/22 0842 1.727 m (5\' 8" )     Head Circumference --      Peak Flow --      Pain Score 07/26/22 0842 8     Pain Loc --      Pain Edu? --      Excl. in [place]? --     Most recent vital signs: Vitals:   07/26/22 0844 07/26/22 1000  BP: 135/66 129/69  Pulse: 88 73  Resp: 18 14  Temp: 98.7 F (37.1 C)   SpO2: 95% 99%     General: Awake, no distress.  CV:  Good peripheral perfusion.  No chest wall rash, no tenderness to palpation Resp:  Normal effort.  Clear to auscultation bilaterally Abd:  No distention.  Other:     ED Results / Procedures / Treatments   Labs (all labs ordered are listed, but only abnormal results are displayed) Labs Reviewed  BASIC METABOLIC PANEL  CBC  TROPONIN I (HIGH SENSITIVITY)  TROPONIN I (HIGH SENSITIVITY)     EKG  ED ECG REPORT I, Jene Every, the attending physician, personally viewed and interpreted this ECG.  Date: 07/26/2022  Rhythm: normal sinus  rhythm QRS Axis: normal Intervals: normal ST/T Wave abnormalities: normal Narrative Interpretation: no evidence of acute ischemia    RADIOLOGY Chest x-ray viewed interpreted by me, no pneumonia    PROCEDURES:  Critical Care performed:   Procedures   MEDICATIONS ORDERED IN ED: Medications - No data to display   IMPRESSION / MDM / ASSESSMENT AND PLAN / ED COURSE  I reviewed the triage vital signs and the nursing notes. Patient's presentation is most consistent with acute presentation with potential threat to life or bodily function.   Patient presents with chest pain as detailed above.  Differential includes pneumonia, ACS, effusion, PE  EKG is overall reassuring, pending high sensitive troponin, labs  Chest x-ray without evidence of pneumonia or effusion  Lab work is reassuring, high-sensitivity troponin is normal.  Recommended CT scan to evaluate for PE however patient declined as she thinks this is likely musculoskeletal, she will treat with NSAIDs and agrees to return if any worsening symptoms      FINAL CLINICAL IMPRESSION(S) / ED DIAGNOSES   Final diagnoses:  Nonspecific chest pain     Rx / DC Orders   ED Discharge Orders     None        Note:  This document was prepared using Dragon voice recognition software and may include unintentional dictation errors.   Jene Every, MD 07/26/22 1057

## 2022-07-26 NOTE — ED Triage Notes (Signed)
Pt via POV from home. Pt c/o L sided chest pain, non-radiating, that started around 0500 this AM, states it woke her up this AM. Pt states pain is sharp. Denies any cardiac hx. Pt states she is SOB. Pt is A&Ox4 and NAD

## 2022-12-31 ENCOUNTER — Other Ambulatory Visit: Payer: Self-pay | Admitting: Family Medicine

## 2022-12-31 DIAGNOSIS — Z1231 Encounter for screening mammogram for malignant neoplasm of breast: Secondary | ICD-10-CM

## 2023-03-09 ENCOUNTER — Emergency Department
Admission: EM | Admit: 2023-03-09 | Discharge: 2023-03-09 | Disposition: A | Discharge status: Home / Self Care | Payer: Medicaid Other | Attending: Emergency Medicine | Admitting: Emergency Medicine

## 2023-03-09 ENCOUNTER — Emergency Department: Payer: Medicaid Other

## 2023-03-09 ENCOUNTER — Other Ambulatory Visit: Payer: Self-pay

## 2023-03-09 DIAGNOSIS — I10 Essential (primary) hypertension: Secondary | ICD-10-CM | POA: Diagnosis not present

## 2023-03-09 DIAGNOSIS — M79622 Pain in left upper arm: Secondary | ICD-10-CM | POA: Insufficient documentation

## 2023-03-09 DIAGNOSIS — W010XXA Fall on same level from slipping, tripping and stumbling without subsequent striking against object, initial encounter: Secondary | ICD-10-CM | POA: Diagnosis not present

## 2023-03-09 NOTE — ED Provider Notes (Signed)
 [facility] Provider Note    Event Date/Time   First MD Initiated Contact with Patient 03/09/23 0745     (approximate)   History   Fall   HPI  Catherine Richardson is a 58 y.o. female with PMH of hypertension, cocaine abuse and alcohol abuse presents for evaluation of left arm pain after a fall yesterday.  Patient states she was coming out of her house when she slipped on the ice causing her to fall and land on her left arm.  Did not hit her head, no LOC.  She reports her pain to the upper arm and gestures to the middle of the humerus.  She states it did not bother her at first but does endorse drinking alcohol and this morning when the liquor wore off it began to hurt.     Physical Exam   Triage Vital Signs: ED Triage Vitals  Encounter Vitals Group     BP 03/09/23 0737 (!) 135/93     Systolic BP Percentile --      Diastolic BP Percentile --      Pulse Rate 03/09/23 0735 90     Resp 03/09/23 0735 16     Temp 03/09/23 0735 97.8 F (36.6 C)     Temp src --      SpO2 03/09/23 0735 97 %     Weight 03/09/23 0735 158 lb (71.7 kg)     Height 03/09/23 0735 5' 8 (1.727 m)     Head Circumference --      Peak Flow --      Pain Score 03/09/23 0735 9     Pain Loc --      Pain Education --      Exclude from Growth Chart --     Most recent vital signs: Vitals:   03/09/23 0735 03/09/23 0737  BP:  (!) 135/93  Pulse: 90   Resp: 16   Temp: 97.8 F (36.6 C)   SpO2: 97%     General: Awake, no distress.  CV:  Good peripheral perfusion.  RRR. Resp:  Normal effort.  CTAB. Abd:  No distention.  Other:  Mild tenderness to palpation over the humerus, shoulder and elbow ROM maintained, sensation intact across all dermatomes, strength is equal in upper extremities, radial pulse 2+ and regular.   ED Results / Procedures / Treatments   Labs (all labs ordered are listed, but only abnormal results are displayed) Labs Reviewed - No data to  display   RADIOLOGY  Left humerus x-ray obtained, I interpreted the images as well as reviewed the radiologist report which was negative for any acute abnormalities.   PROCEDURES:  Critical Care performed: No  Procedures   MEDICATIONS ORDERED IN ED: Medications - No data to display   IMPRESSION / MDM / ASSESSMENT AND PLAN / ED COURSE  I reviewed the triage vital signs and the nursing notes.                             58 year old female presents for evaluation of left arm pain after a fall yesterday.  Vital signs were stable aside from patient's blood pressure being elevated but she does have a history of hypertension.  Patient NAD on exam.  Differential diagnosis includes, but is not limited to, fracture, dislocation, contusion, muscle strain.  Patient's presentation is most consistent with acute complicated illness / injury requiring diagnostic workup.  X-rays of the  left humerus were negative.  Physical exam was reassuring.  We discussed over-the-counter pain management using Tylenol , ibuprofen  as well as topical pain relievers.  She voiced understanding, all questions were answered and she was stable at discharge.      FINAL CLINICAL IMPRESSION(S) / ED DIAGNOSES   Final diagnoses:  Left upper arm pain     Rx / DC Orders   ED Discharge Orders     None        Note:  This document was prepared using Dragon voice recognition software and may include unintentional dictation errors.   Cleaster Tinnie LABOR, PA-C 03/09/23 9150    Levander Slate, MD 03/09/23 1534

## 2023-03-09 NOTE — Discharge Instructions (Signed)
 The x-rays of your left arm did not show any fractures today.  You may be sore from the fall for a couple days.  You can take Tylenol and ibuprofen as needed for your pain.  I also recommend using heat, ice and topical pain relievers.

## 2023-03-09 NOTE — ED Triage Notes (Signed)
 Pt to ED for fall yesterday, slipped on ice. Denies LOC. Reports left arm pain. Full  movement to arm

## 2023-04-30 ENCOUNTER — Emergency Department

## 2023-04-30 ENCOUNTER — Other Ambulatory Visit: Payer: Self-pay

## 2023-04-30 ENCOUNTER — Emergency Department
Admission: EM | Admit: 2023-04-30 | Discharge: 2023-04-30 | Disposition: A | Discharge status: Home / Self Care | Attending: Emergency Medicine | Admitting: Emergency Medicine

## 2023-04-30 ENCOUNTER — Encounter: Payer: Self-pay | Admitting: Emergency Medicine

## 2023-04-30 DIAGNOSIS — B349 Viral infection, unspecified: Secondary | ICD-10-CM | POA: Insufficient documentation

## 2023-04-30 DIAGNOSIS — I10 Essential (primary) hypertension: Secondary | ICD-10-CM | POA: Diagnosis not present

## 2023-04-30 DIAGNOSIS — R059 Cough, unspecified: Secondary | ICD-10-CM | POA: Diagnosis present

## 2023-04-30 LAB — RESP PANEL BY RT-PCR (RSV, FLU A&B, COVID)  RVPGX2
Influenza A by PCR: NEGATIVE
Influenza B by PCR: NEGATIVE
Resp Syncytial Virus by PCR: NEGATIVE
SARS Coronavirus 2 by RT PCR: NEGATIVE

## 2023-04-30 MED ORDER — ONDANSETRON 4 MG PO TBDP: 4.0000 mg | ORAL_TABLET | Freq: Three times a day (TID) | ORAL | 0 refills | Status: DC | PRN

## 2023-04-30 MED ORDER — NAPROXEN 500 MG PO TABS: 500.0000 mg | ORAL_TABLET | Freq: Two times a day (BID) | ORAL | 0 refills | Status: AC

## 2023-04-30 MED ORDER — GUAIFENESIN-CODEINE 100-10 MG/5ML PO SOLN: 10.0000 mL | Freq: Three times a day (TID) | ORAL | 0 refills | Status: AC | PRN

## 2023-04-30 NOTE — ED Provider Notes (Signed)
 [facility] Provider Note    Event Date/Time   First MD Initiated Contact with Patient 04/30/23 [phone]     (approximate)   History   Cough   HPI  Catherine Richardson is a 58 y.o. female  with history of hypertension, cocaine abuse, ETOH abuse and as listed in EMR presents to the emergency department for treatment and evaluation of fever, productive cough with yellow sputum, congestion, and bodyaches and chills x 3 days. No vomiting or diarrhea.       Physical Exam   Triage Vital Signs: ED Triage Vitals  Encounter Vitals Group     BP 04/30/23 0641 (!) 146/76     Systolic BP Percentile --      Diastolic BP Percentile --      Pulse Rate 04/30/23 0641 87     Resp 04/30/23 0641 16     Temp 04/30/23 0641 98.1 F (36.7 C)     Temp Source 04/30/23 0641 Oral     SpO2 04/30/23 0641 98 %     Weight 04/30/23 0638 165 lb (74.8 kg)     Height 04/30/23 0638 5\' 8"  (1.727 m)     Head Circumference --      Peak Flow --      Pain Score 04/30/23 0638 8     Pain Loc --      Pain Education --      Exclude from Growth Chart --     Most recent vital signs: Vitals:   04/30/23 0641  BP: (!) 146/76  Pulse: 87  Resp: 16  Temp: 98.1 F (36.7 C)  SpO2: 98%    General: Awake, no distress.  CV:  Good peripheral perfusion.  Resp:  Normal effort. Breath sounds clear throughout. Abd:  No distention.  Other:     ED Results / Procedures / Treatments   Labs (all labs ordered are listed, but only abnormal results are displayed) Labs Reviewed  RESP PANEL BY RT-PCR (RSV, FLU A&B, COVID)  [id number]     EKG  Not indicated.   RADIOLOGY  Image and radiology report reviewed and interpreted by me. Radiology report consistent with the same.  Chest x-ray without acute concerns.  PROCEDURES:  Critical Care performed: No  Procedures   MEDICATIONS ORDERED IN ED:  Medications - No data to display   IMPRESSION / MDM / ASSESSMENT AND PLAN / ED COURSE   I  have reviewed the triage note.  Differential diagnosis includes, but is not limited to, Covid, Influenza, RSV, Acute Viral Syndrome, Pneumonia  Patient's presentation is most consistent with acute illness / injury with system symptoms.  58 year old female presents to the emergency department for treatment and evaluation of fever, productive cough, congestion, and body aches for the past several days. See HPI.  Exam is reassuring. Vital signs normal except mild hypertension. Respiratory panel is negative and chest x-ray is normal.  Results reviewed with patient. She will be discharged home with symptomatic care. ER return precautions discussed.      FINAL CLINICAL IMPRESSION(S) / ED DIAGNOSES   Final diagnoses:  Acute viral syndrome     Rx / DC Orders   ED Discharge Orders          Ordered    guaiFENesin-codeine 100-10 MG/5ML syrup  3 times daily PRN        04/30/23 0745    naproxen (NAPROSYN) 500 MG tablet  2 times daily with meals  04/30/23 0745    ondansetron (ZOFRAN-ODT) 4 MG disintegrating tablet  Every 8 hours PRN        04/30/23 0745             Note:  This document was prepared using Dragon voice recognition software and may include unintentional dictation errors.   Chinita Pester, FNP 04/30/23 1438    Janith Lima, MD 04/30/23 [phone]

## 2023-04-30 NOTE — ED Triage Notes (Signed)
 Patient ambulatory to triage with steady gait, without difficulty or distress noted; pt reports since Tuesday having fever, prod cough yellow sputum, congestion and bodyaches with chills

## 2023-04-30 NOTE — Discharge Instructions (Signed)
 Do not drive or use machinery for at least 8 hours after taking the cough medication.  Follow up with your primary care provider if not improving over the week.  Return to the ER for symptoms that change or worsen if unable to schedule an appointment.

## 2023-04-30 NOTE — ED Notes (Signed)
 See triage notes. Patient c/o fever, productive cough with yellow sputum, congestion, body aches and chills since Tuesday.

## 2023-08-17 ENCOUNTER — Emergency Department
Admission: EM | Admit: 2023-08-17 | Discharge: 2023-08-17 | Disposition: A | Discharge status: Home / Self Care | Attending: Emergency Medicine | Admitting: Emergency Medicine

## 2023-08-17 ENCOUNTER — Other Ambulatory Visit: Payer: Self-pay

## 2023-08-17 DIAGNOSIS — R1084 Generalized abdominal pain: Secondary | ICD-10-CM | POA: Insufficient documentation

## 2023-08-17 DIAGNOSIS — E876 Hypokalemia: Secondary | ICD-10-CM | POA: Diagnosis not present

## 2023-08-17 DIAGNOSIS — R197 Diarrhea, unspecified: Secondary | ICD-10-CM | POA: Insufficient documentation

## 2023-08-17 DIAGNOSIS — R112 Nausea with vomiting, unspecified: Secondary | ICD-10-CM | POA: Insufficient documentation

## 2023-08-17 LAB — COMPREHENSIVE METABOLIC PANEL WITH GFR
ALT: 22 U/L (ref 0–44)
AST: 28 U/L (ref 15–41)
Albumin: 3.9 g/dL (ref 3.5–5.0)
Alkaline Phosphatase: 110 U/L (ref 38–126)
Anion gap: 12 (ref 5–15)
BUN: 20 mg/dL (ref 6–20)
CO2: 22 mmol/L (ref 22–32)
Calcium: 9 mg/dL (ref 8.9–10.3)
Chloride: 106 mmol/L (ref 98–111)
Creatinine, Ser: 0.47 mg/dL (ref 0.44–1.00)
GFR, Estimated: >60 mL/min (ref >60)
Glucose, Bld: 131 mg/dL — ABNORMAL HIGH (ref 70–99)
Potassium: 3.1 mmol/L — ABNORMAL LOW (ref 3.5–5.1)
Sodium: 140 mmol/L (ref 135–145)
Total Bilirubin: 1 mg/dL (ref 0.0–1.2)
Total Protein: 7.6 g/dL (ref 6.5–8.1)

## 2023-08-17 LAB — CBC
HCT: 38.1 % (ref 36.0–46.0)
Hemoglobin: 12.6 g/dL (ref 12.0–15.0)
MCH: 32 pg (ref 26.0–34.0)
MCHC: 33.1 g/dL (ref 30.0–36.0)
MCV: 96.7 fL (ref 80.0–100.0)
Platelets: 292 10*3/uL (ref 150–400)
RBC: 3.94 MIL/uL (ref 3.87–5.11)
RDW: 12.6 % (ref 11.5–15.5)
WBC: 6.9 10*3/uL (ref 4.0–10.5)
nRBC: 0 % (ref 0.0–0.2)

## 2023-08-17 LAB — MAGNESIUM: Magnesium: 1.4 mg/dL — ABNORMAL LOW (ref 1.7–2.4)

## 2023-08-17 LAB — LIPASE, BLOOD: Lipase: 41 U/L (ref 11–51)

## 2023-08-17 MED ORDER — LOPERAMIDE HCL 2 MG PO CAPS
2.0000 mg | ORAL_CAPSULE | Freq: Once | ORAL | Status: AC
  Administered 2023-08-17: 2 mg via ORAL
  Filled 2023-08-17: qty 1

## 2023-08-17 MED ORDER — POTASSIUM CHLORIDE CRYS ER 20 MEQ PO TBCR
40.0000 meq | EXTENDED_RELEASE_TABLET | Freq: Once | ORAL | Status: AC
  Administered 2023-08-17: 40 meq via ORAL
  Filled 2023-08-17: qty 2

## 2023-08-17 MED ORDER — POTASSIUM CHLORIDE CRYS ER 20 MEQ PO TBCR: 40.0000 meq | EXTENDED_RELEASE_TABLET | Freq: Two times a day (BID) | ORAL | 0 refills | Status: AC

## 2023-08-17 MED ORDER — ONDANSETRON HCL 4 MG/2ML IJ SOLN
4.0000 mg | Freq: Once | INTRAMUSCULAR | Status: AC
  Administered 2023-08-17: 4 mg via INTRAVENOUS
  Filled 2023-08-17: qty 2

## 2023-08-17 MED ORDER — SODIUM CHLORIDE 0.9 % IV BOLUS
500.0000 mL | Freq: Once | INTRAVENOUS | Status: AC
  Administered 2023-08-17: 500 mL via INTRAVENOUS

## 2023-08-17 MED ORDER — MAGNESIUM SULFATE 2 GM/50ML IV SOLN
2.0000 g | Freq: Once | INTRAVENOUS | Status: AC
  Administered 2023-08-17: 2 g via INTRAVENOUS
  Filled 2023-08-17: qty 50

## 2023-08-17 MED ORDER — ONDANSETRON 4 MG PO TBDP: 4.0000 mg | ORAL_TABLET | Freq: Three times a day (TID) | ORAL | 0 refills | Status: AC | PRN

## 2023-08-17 MED ORDER — ONDANSETRON 4 MG PO TBDP
4.0000 mg | ORAL_TABLET | Freq: Once | ORAL | Status: AC
  Administered 2023-08-17: 4 mg via ORAL
  Filled 2023-08-17: qty 1

## 2023-08-17 NOTE — Discharge Instructions (Addendum)
 You are seen in the emergency department for nausea, vomiting and diarrhea.  Your lab work showed multiple electrolytes that were low including your potassium and magnesium .  You were given IV magnesium  in the emergency department.  You are also given IV fluids and nausea medications.  You most likely have a viral illness.  It is importantly return to the emergency department you have any worsening symptoms or signs of dehydration.  Get Pedialyte, Powerade or Gatorade for oral hydration  Your potassium was mildly low when checked today.  You are given a prescription for potassium tablets to take for the next day.  Make sure to follow up with a primary doctor to follow up your labs.  Make sure to eat food high in potassium and magnesium  - examples - potatoes, spinach, bananas, beans, avocadoes, oranges, nuts.  zofran  (ondansetron ) - nausea medication, take 1-2 tablets every 8 hours as needed for nausea/vomiting.

## 2023-08-17 NOTE — ED Provider Notes (Signed)
 [facility] Provider Note    Event Date/Time   First MD Initiated Contact with Patient 08/17/23 1500     (approximate)   History   Abdominal Pain   HPI  Catherine Richardson is a 58 y.o. female past medical history significant for alcohol abuse who presents to the emergency department not feeling well.  States that she started having nausea, vomiting and diarrhea when she got to work today.  Diffuse abdominal cramping.  Denies fever or chills.  Denies dysuria, urinary urgency or frequency.  Denies any blood in her stool or melena.  Last drink of alcohol was yesterday and states that she does not feel like she is withdrawing from alcohol.  Denies any recent antibiotic use.     Physical Exam   Triage Vital Signs: ED Triage Vitals [08/17/23 1239]  Encounter Vitals Group     BP (!) 144/73     Girls Systolic BP Percentile      Girls Diastolic BP Percentile      Boys Systolic BP Percentile      Boys Diastolic BP Percentile      Pulse Rate (!) 105     Resp 17     Temp 98.7 F (37.1 C)     Temp Source Oral     SpO2 99 %     Weight 165 lb (74.8 kg)     Height 5' 9 (1.753 m)     Head Circumference      Peak Flow      Pain Score 8     Pain Loc      Pain Education      Exclude from Growth Chart     Most recent vital signs: Vitals:   08/17/23 1239 08/17/23 1522  BP: (!) 144/73 126/61  Pulse: (!) 105 82  Resp: 17 18  Temp: 98.7 F (37.1 C) 98.1 F (36.7 C)  SpO2: 99% 98%    Physical Exam Constitutional:      Appearance: She is well-developed.  HENT:     Head: Atraumatic.   Eyes:     Conjunctiva/sclera: Conjunctivae normal.    Cardiovascular:     Rate and Rhythm: Regular rhythm.  Pulmonary:     Effort: No respiratory distress.  Abdominal:     General: There is no distension.     Tenderness: There is no abdominal tenderness.   Musculoskeletal:        General: Normal range of motion.     Cervical back: Normal range of motion.    Skin:    General: Skin is warm.     Capillary Refill: Capillary refill takes less than 2 seconds.   Neurological:     General: No focal deficit present.     Mental Status: She is alert. Mental status is at baseline.   Psychiatric:        Mood and Affect: Mood normal.     IMPRESSION / MDM / ASSESSMENT AND PLAN / ED COURSE  I reviewed the triage vital signs and the nursing notes.  Differential diagnosis including viral gastroenteritis, dehydration, electrolyte abnormality, alcohol withdrawal, pancreatitis  EKG  I, Clotilda Punter, the attending physician, personally viewed and interpreted this ECG.   Rate: Normal  Rhythm: Normal sinus  Axis: Normal  Intervals: Normal.  Normal QT interval  ST&T Change: None  No tachycardic or bradycardic dysrhythmias while on cardiac telemetry.   LABS (all labs ordered are listed, but only abnormal results are displayed) Labs interpreted  as -    Labs Reviewed  COMPREHENSIVE METABOLIC PANEL WITH GFR - Abnormal; Notable for the following components:      Result Value   Potassium 3.1 (*)    Glucose, Bld 131 (*)    All other components within normal limits  MAGNESIUM  - Abnormal; Notable for the following components:   Magnesium  1.4 (*)    All other components within normal limits  LIPASE, BLOOD  CBC  URINALYSIS, ROUTINE W REFLEX MICROSCOPIC     MDM  No leukocytosis.  Potassium low at 3.1, magnesium  level is 1.4.  Lipase level within normal limits.  Given antiemetics with Zofran , IV fluids.  Will give 2 g of mag for repletion.  Given p.o. potassium.  Have very low suspicion for C. difficile, no recent antibiotic use and no copious diarrhea.  No recent travel, no blood in her stool, patient is requesting Imodium.  Will give 1 dose of Imodium in the emergency department.  Abdomen nontender to palpation of low suspicion for acute appendicitis or other acute intra-abdominal pathology.  Tolerating p.o.  Patient threw up the p.o. potassium.   Given IV magnesium .  Given a prescription for potassium repletion.  Discussed return precautions.  Patient states that she has a primary care physician she can follow-up as an outpatient to repeat lab work/electrolytes     PROCEDURES:  Critical Care performed: No  Procedures  Patient's presentation is most consistent with acute presentation with potential threat to life or bodily function.   MEDICATIONS ORDERED IN ED: Medications  ondansetron  (ZOFRAN -ODT) disintegrating tablet 4 mg (4 mg Oral Given 08/17/23 1524)  ondansetron  (ZOFRAN ) injection 4 mg (4 mg Intravenous Given 08/17/23 1615)  sodium chloride  0.9 % bolus 500 mL (0 mLs Intravenous Stopped 08/17/23 1746)  magnesium  sulfate IVPB 2 g 50 mL (0 g Intravenous Stopped 08/17/23 1746)  potassium chloride SA (KLOR-CON M) CR tablet 40 mEq (40 mEq Oral Given 08/17/23 1612)  loperamide (IMODIUM) capsule 2 mg (2 mg Oral Given 08/17/23 1612)    FINAL CLINICAL IMPRESSION(S) / ED DIAGNOSES   Final diagnoses:  Generalized abdominal pain  Nausea vomiting and diarrhea  Hypokalemia  Hypomagnesemia     Rx / DC Orders   ED Discharge Orders          Ordered    ondansetron  (ZOFRAN -ODT) 4 MG disintegrating tablet  Every 8 hours PRN        08/17/23 1746    potassium chloride SA (KLOR-CON M) 20 MEQ tablet  2 times daily        08/17/23 1746             Note:  This document was prepared using Dragon voice recognition software and may include unintentional dictation errors.   Suzanne Kirsch, MD 08/17/23 [phone]

## 2023-08-17 NOTE — ED Notes (Signed)
 Pt started having projectile vomiting at this time. Potassium pills and imodium noted in vomit. Dr Suzanne notified.

## 2023-08-17 NOTE — ED Notes (Signed)
 See triage note  Presents with some n/v/d   States this started this am  Denies any fever

## 2023-08-17 NOTE — ED Triage Notes (Signed)
 Pt sts that she has been having N/V/D since this AM. Pt sts that she is dry heaving at this time.

## 2023-08-27 ENCOUNTER — Other Ambulatory Visit: Payer: Self-pay | Admitting: Primary Care

## 2023-08-27 DIAGNOSIS — Z1231 Encounter for screening mammogram for malignant neoplasm of breast: Secondary | ICD-10-CM
